# Patient Record
Sex: Male | Born: 1970 | Race: White | Hispanic: No | Marital: Married | Smoking: Former smoker
Health system: Southern US, Community
[De-identification: ages and names within clinical notes are randomized; demographics above are authoritative.]

## PROBLEM LIST (undated history)

## (undated) DIAGNOSIS — Z21 Asymptomatic human immunodeficiency virus [HIV] infection status: Secondary | ICD-10-CM

## (undated) DIAGNOSIS — B20 Human immunodeficiency virus [HIV] disease: Secondary | ICD-10-CM

## (undated) DIAGNOSIS — A0472 Enterocolitis due to Clostridium difficile, not specified as recurrent: Secondary | ICD-10-CM

## (undated) DIAGNOSIS — B1910 Unspecified viral hepatitis B without hepatic coma: Secondary | ICD-10-CM

## (undated) DIAGNOSIS — T7840XA Allergy, unspecified, initial encounter: Secondary | ICD-10-CM

## (undated) DIAGNOSIS — Z8571 Personal history of Hodgkin lymphoma: Secondary | ICD-10-CM

## (undated) DIAGNOSIS — R569 Unspecified convulsions: Secondary | ICD-10-CM

## (undated) DIAGNOSIS — G40909 Epilepsy, unspecified, not intractable, without status epilepticus: Secondary | ICD-10-CM

## (undated) HISTORY — DX: Human immunodeficiency virus (HIV) disease: B20

## (undated) HISTORY — DX: Personal history of Hodgkin lymphoma: Z85.71

## (undated) HISTORY — PX: BACK SURGERY: SHX140

## (undated) HISTORY — PX: TONSILLECTOMY: SUR1361

## (undated) HISTORY — DX: Enterocolitis due to Clostridium difficile, not specified as recurrent: A04.72

## (undated) HISTORY — DX: Asymptomatic human immunodeficiency virus (hiv) infection status: Z21

## (undated) HISTORY — DX: Epilepsy, unspecified, not intractable, without status epilepticus: G40.909

## (undated) HISTORY — DX: Allergy, unspecified, initial encounter: T78.40XA

## (undated) HISTORY — DX: Unspecified convulsions: R56.9

---

## 1999-04-03 DIAGNOSIS — Z8571 Personal history of Hodgkin lymphoma: Secondary | ICD-10-CM

## 1999-04-03 HISTORY — DX: Personal history of Hodgkin lymphoma: Z85.71

## 1999-04-03 HISTORY — PX: PORT-A-CATH REMOVAL: SHX5289

## 1999-04-03 HISTORY — PX: LYMPH NODE BIOPSY: SHX201

## 2000-01-31 ENCOUNTER — Encounter: Payer: Self-pay | Admitting: Emergency Medicine

## 2000-01-31 ENCOUNTER — Emergency Department (HOSPITAL_COMMUNITY): Admission: EM | Admit: 2000-01-31 | Discharge: 2000-01-31 | Payer: Self-pay | Admitting: Emergency Medicine

## 2001-03-22 ENCOUNTER — Inpatient Hospital Stay (HOSPITAL_COMMUNITY): Admission: AC | Admit: 2001-03-22 | Discharge: 2001-03-29 | Payer: Self-pay

## 2001-03-22 ENCOUNTER — Encounter: Payer: Self-pay | Admitting: Emergency Medicine

## 2001-03-23 ENCOUNTER — Encounter: Payer: Self-pay | Admitting: Neurosurgery

## 2001-03-25 ENCOUNTER — Encounter: Payer: Self-pay | Admitting: General Surgery

## 2001-03-29 ENCOUNTER — Encounter: Payer: Self-pay | Admitting: Neurosurgery

## 2001-04-28 ENCOUNTER — Encounter: Payer: Self-pay | Admitting: Neurosurgery

## 2001-04-28 ENCOUNTER — Ambulatory Visit (HOSPITAL_COMMUNITY): Admission: RE | Admit: 2001-04-28 | Discharge: 2001-04-28 | Payer: Self-pay | Admitting: Neurosurgery

## 2001-07-03 ENCOUNTER — Encounter: Payer: Self-pay | Admitting: Neurosurgery

## 2001-07-03 ENCOUNTER — Ambulatory Visit (HOSPITAL_COMMUNITY): Admission: RE | Admit: 2001-07-03 | Discharge: 2001-07-03 | Payer: Self-pay | Admitting: Neurosurgery

## 2002-01-02 ENCOUNTER — Encounter: Admission: RE | Admit: 2002-01-02 | Discharge: 2002-01-02 | Payer: Self-pay | Admitting: Neurosurgery

## 2002-01-02 ENCOUNTER — Encounter: Payer: Self-pay | Admitting: Neurosurgery

## 2004-02-28 ENCOUNTER — Emergency Department (HOSPITAL_COMMUNITY): Admission: EM | Admit: 2004-02-28 | Discharge: 2004-02-28 | Payer: Self-pay | Admitting: Emergency Medicine

## 2007-03-03 ENCOUNTER — Emergency Department (HOSPITAL_COMMUNITY): Admission: EM | Admit: 2007-03-03 | Discharge: 2007-03-03 | Payer: Self-pay | Admitting: *Deleted

## 2007-06-11 ENCOUNTER — Emergency Department (HOSPITAL_COMMUNITY): Admission: EM | Admit: 2007-06-11 | Discharge: 2007-06-11 | Payer: Self-pay | Admitting: Emergency Medicine

## 2007-06-21 ENCOUNTER — Emergency Department (HOSPITAL_COMMUNITY): Admission: EM | Admit: 2007-06-21 | Discharge: 2007-06-21 | Payer: Self-pay | Admitting: Emergency Medicine

## 2007-10-14 ENCOUNTER — Ambulatory Visit (HOSPITAL_COMMUNITY): Admission: RE | Admit: 2007-10-14 | Discharge: 2007-10-14 | Payer: Self-pay | Admitting: Preventative Medicine
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
## 2010-09-19 ENCOUNTER — Ambulatory Visit (INDEPENDENT_AMBULATORY_CARE_PROVIDER_SITE_OTHER): Payer: BC Managed Care – PPO

## 2010-09-19 DIAGNOSIS — Z113 Encounter for screening for infections with a predominantly sexual mode of transmission: Secondary | ICD-10-CM

## 2010-09-19 DIAGNOSIS — B20 Human immunodeficiency virus [HIV] disease: Secondary | ICD-10-CM

## 2010-09-19 DIAGNOSIS — Z111 Encounter for screening for respiratory tuberculosis: Secondary | ICD-10-CM

## 2010-09-20 LAB — COMPREHENSIVE METABOLIC PANEL
ALT: 13 U/L (ref 0–53)
Alkaline Phosphatase: 85 U/L (ref 39–117)
CO2: 24 mEq/L (ref 19–32)
Potassium: 4.6 mEq/L (ref 3.5–5.3)
Sodium: 140 mEq/L (ref 135–145)
Total Bilirubin: 0.4 mg/dL (ref 0.3–1.2)
Total Protein: 6.6 g/dL (ref 6.0–8.3)

## 2010-09-20 LAB — HIV-1 RNA QUANT-NO REFLEX-BLD: HIV 1 RNA Quant: 35 copies/mL — ABNORMAL HIGH (ref <20)

## 2010-09-20 LAB — CBC WITH DIFFERENTIAL/PLATELET
Eosinophils Absolute: 0.2 10*3/uL (ref 0.0–0.7)
Lymphocytes Relative: 40 % (ref 12–46)
Lymphs Abs: 2.2 10*3/uL (ref 0.7–4.0)
MCH: 32.7 pg (ref 26.0–34.0)
Neutro Abs: 2.7 10*3/uL (ref 1.7–7.7)
Neutrophils Relative %: 50 % (ref 43–77)
Platelets: 156 10*3/uL (ref 150–400)
RBC: 4.86 MIL/uL (ref 4.22–5.81)
WBC: 5.4 10*3/uL (ref 4.0–10.5)

## 2010-09-20 LAB — RPR

## 2010-09-20 LAB — HEPATITIS B CORE ANTIBODY, IGM: Hep B C IgM: NEGATIVE

## 2010-09-20 LAB — HEPATITIS A ANTIBODY, IGM: Hep A IgM: NEGATIVE

## 2010-09-20 LAB — LIPID PANEL
HDL: 48 mg/dL (ref >39)
LDL Cholesterol: 123 mg/dL — ABNORMAL HIGH (ref 0–99)
Total CHOL/HDL Ratio: 4.1 Ratio

## 2010-09-20 LAB — HEPATITIS B SURFACE ANTIBODY,QUALITATIVE: Hep B S Ab: POSITIVE — AB

## 2010-10-02 ENCOUNTER — Encounter: Payer: Self-pay | Admitting: Adult Health

## 2010-10-02 ENCOUNTER — Ambulatory Visit (INDEPENDENT_AMBULATORY_CARE_PROVIDER_SITE_OTHER): Payer: BC Managed Care – PPO | Admitting: Adult Health

## 2010-10-02 DIAGNOSIS — Z8571 Personal history of Hodgkin lymphoma: Secondary | ICD-10-CM

## 2010-10-02 DIAGNOSIS — G40909 Epilepsy, unspecified, not intractable, without status epilepticus: Secondary | ICD-10-CM

## 2010-10-02 DIAGNOSIS — B20 Human immunodeficiency virus [HIV] disease: Secondary | ICD-10-CM

## 2010-10-02 DIAGNOSIS — E785 Hyperlipidemia, unspecified: Secondary | ICD-10-CM | POA: Insufficient documentation

## 2010-10-02 DIAGNOSIS — Z21 Asymptomatic human immunodeficiency virus [HIV] infection status: Secondary | ICD-10-CM

## 2010-10-02 MED ORDER — EFAVIRENZ-EMTRICITAB-TENOFOVIR 600-200-300 MG PO TABS: 1.0000 | ORAL_TABLET | Freq: Every day | ORAL | Status: DC

## 2010-10-02 NOTE — Progress Notes (Signed)
Subjective:    Patient ID: Jorge Holt, male    DOB: 1970-07-16, 40 y.o.   MRN: [id number]  HPI 40-year-old, white male with a history of HIV, diagnosed in 2001 presents to clinic today for evaluation and ongoing care. Has a history of Hodgkin's disease, diagnosed 3 months after his HIV diagnosis for which he was treated at [facility] with full remission. Since then, he is been on antiretrovirals which has included Viracept, Ziagen, Viread, Retrovir, and Epivir. Up until 18 months ago. He had been on this regimen, but was switched to Atripla as a result of chronic GI intolerance. States his viral load has been "undetectable" for some period of time while on his antiretrovirals. He also has a history of seizure disorder since childhood and currently is taking Depakote therapy. States he has not had a seizure in several years. Today. He voices no physical complaints. He endorses adherence to his antiretrovirals with good tolerance and no complications.   Review of Systems  Constitutional: Negative for fever, chills, diaphoresis, activity change, appetite change, fatigue and unexpected weight change.  HENT: Negative for hearing loss, ear pain, nosebleeds, congestion, sore throat, facial swelling, rhinorrhea, sneezing, drooling, mouth sores, trouble swallowing, neck pain, neck stiffness, dental problem, voice change, postnasal drip, sinus pressure, tinnitus and ear discharge.   Eyes: Negative for photophobia, pain, discharge, redness, itching and visual disturbance.  Respiratory: Negative for apnea, cough, choking, chest tightness, shortness of breath, wheezing and stridor.   Cardiovascular: Negative for chest pain, palpitations and leg swelling.  Gastrointestinal: Negative for nausea, vomiting, abdominal pain, diarrhea, constipation, blood in stool, abdominal distention, anal bleeding and rectal pain.  Genitourinary: Negative for dysuria, urgency, frequency ( ), hematuria, flank pain,  decreased urine volume, discharge, penile swelling, scrotal swelling, enuresis, difficulty urinating, genital sores, penile pain and testicular pain.  Musculoskeletal: Positive for back pain. Negative for myalgias, joint swelling, arthralgias and gait problem.  Skin: Negative for color change, pallor and rash.  Neurological: Positive for seizures. Negative for dizziness, tremors, syncope, facial asymmetry, speech difficulty, weakness, light-headedness, numbness and headaches.  Hematological: Negative for adenopathy. Does not bruise/bleed easily.  Psychiatric/Behavioral: Negative for suicidal ideas, hallucinations, behavioral problems, confusion, sleep disturbance, self-injury, dysphoric mood, decreased concentration and agitation. The patient is not nervous/anxious and is not hyperactive.        Objective:   Physical Exam  Constitutional: He is oriented to person, place, and time. He appears well-developed and well-nourished. No distress.  HENT:  Head: Normocephalic and atraumatic.  Right Ear: External ear normal.  Left Ear: External ear normal.  Nose: Nose normal.  Mouth/Throat: Oropharynx is clear and moist.  Eyes: Conjunctivae and EOM are normal. Pupils are equal, round, and reactive to light. Right eye exhibits no discharge. Left eye exhibits no discharge.  Neck: Normal range of motion. Neck supple. No JVD present. No tracheal deviation present. No thyromegaly present.  Cardiovascular: Normal rate, regular rhythm, normal heart sounds and intact distal pulses.  Exam reveals no gallop and no friction rub.   No murmur heard. Pulmonary/Chest: Effort normal and breath sounds normal. No stridor. No respiratory distress. He has no wheezes. He has no rales. He exhibits no tenderness.  Abdominal: Soft. Bowel sounds are normal. He exhibits no distension and no mass. There is no tenderness. There is no rebound and no guarding.  Genitourinary: Rectum normal and penis normal.  Musculoskeletal: Normal  range of motion. He exhibits no edema and no tenderness.  There is a midline surgical scar in the LS-spine area. It is intact and well approximated  Lymphadenopathy:    He has no cervical adenopathy.  Neurological: He is alert and oriented to person, place, and time. He has normal reflexes. No cranial nerve deficit. He exhibits normal muscle tone. Coordination normal.  Skin: Skin is warm and dry. No rash noted. He is not diaphoretic. No erythema. No pallor.  Psychiatric: He has a normal mood and affect. His behavior is normal. Judgment and thought content normal.          Assessment & Plan:  1. HIV. Does obtain 09/19/2010 show a CD4 count 790 at 34% with a viral load of 35. Copies/mL. He remains clinically stable on his current regimen. Recommend continuing present management, repeating labs in 10 weeks with a followup in 3 months.  2. Seizure disorder. No recorded seizures of recent, but would advise valproic acid level due to the fact that he has been on Sustiva-based regimen. We will obtain this on his next blood draw.  3. Dyslipidemia. Was originally prescribed, Zocor, but he states he did not take it and instead utilized, exercise and diet modification. His cholesterol obtained on 09/19/2010 show a total cholesterol of 195 mg/dL, triglycerides of 161 mg/dL, HDL of 48 mg/dL calculated, LDL of 096 mg/dL, and his VLDL 24 mg/dL. Given that he has made. Most modifications himself, we will at this time concur to hold antilipemic therapy, and continue to monitor for now.  He verbally acknowledged all this information and agreed with plan of care. On his next blood draw, we will obtain, in addition to staging labs, lipids, valproic acid level, testosterone, free and total, and hepatitis. A total antibody.

## 2010-12-25 LAB — COMPREHENSIVE METABOLIC PANEL
Alkaline Phosphatase: 69
BUN: 12
Chloride: 108
Glucose, Bld: 91
Potassium: 4.3
Total Bilirubin: 0.6

## 2011-01-08 LAB — DIFFERENTIAL
Basophils Relative: 0
Monocytes Relative: 5
Neutro Abs: 5.8
Neutrophils Relative %: 77

## 2011-01-08 LAB — COMPREHENSIVE METABOLIC PANEL
Alkaline Phosphatase: 88
BUN: 14
Calcium: 9.3
GFR calc non Af Amer: >60
Glucose, Bld: 105 — ABNORMAL HIGH
Potassium: 4.3
Total Protein: 6

## 2011-01-08 LAB — RAPID URINE DRUG SCREEN, HOSP PERFORMED
Barbiturates: NOT DETECTED
Opiates: NOT DETECTED

## 2011-01-08 LAB — CBC
HCT: 44.5
Hemoglobin: 15.1
MCHC: 34.1
RDW: 12.1

## 2011-02-06 ENCOUNTER — Telehealth: Payer: Self-pay | Admitting: *Deleted

## 2011-02-06 NOTE — Telephone Encounter (Signed)
I left message to call & schedule appt. If he has questions about this to call & ask for me

## 2011-03-01 ENCOUNTER — Encounter: Payer: Self-pay | Admitting: *Deleted

## 2011-03-06 ENCOUNTER — Other Ambulatory Visit: Payer: Self-pay | Admitting: Adult Health

## 2011-03-06 DIAGNOSIS — B20 Human immunodeficiency virus [HIV] disease: Secondary | ICD-10-CM

## 2011-03-14 ENCOUNTER — Telehealth: Payer: Self-pay | Admitting: *Deleted

## 2011-03-14 NOTE — Telephone Encounter (Signed)
Pt's PCP is Dr. Madelin Rear @ [facility].  Would appreciate 03/21/11 lab results being faxed to Dr. Flavia Shipper office when available.  Pt verbalized understanding to come to [facility] to have labs drawn 2-4pm.

## 2011-03-21 ENCOUNTER — Other Ambulatory Visit (INDEPENDENT_AMBULATORY_CARE_PROVIDER_SITE_OTHER): Payer: BC Managed Care – PPO

## 2011-03-21 ENCOUNTER — Other Ambulatory Visit: Payer: Self-pay | Admitting: Infectious Diseases

## 2011-03-21 DIAGNOSIS — G40909 Epilepsy, unspecified, not intractable, without status epilepticus: Secondary | ICD-10-CM

## 2011-03-21 DIAGNOSIS — B20 Human immunodeficiency virus [HIV] disease: Secondary | ICD-10-CM

## 2011-03-21 DIAGNOSIS — E785 Hyperlipidemia, unspecified: Secondary | ICD-10-CM

## 2011-03-21 LAB — LIPID PANEL
HDL: 46 mg/dL (ref >39)
Total CHOL/HDL Ratio: 3.8 Ratio
VLDL: 17 mg/dL (ref 0–40)

## 2011-03-22 LAB — COMPLETE METABOLIC PANEL WITH GFR
ALT: 15 U/L (ref 0–53)
Albumin: 4.5 g/dL (ref 3.5–5.2)
CO2: 24 mEq/L (ref 19–32)
Chloride: 104 mEq/L (ref 96–112)
GFR, Est African American: >89 mL/min
GFR, Est Non African American: >89 mL/min
Glucose, Bld: 79 mg/dL (ref 70–99)
Potassium: 4.6 mEq/L (ref 3.5–5.3)
Sodium: 140 mEq/L (ref 135–145)
Total Protein: 6.3 g/dL (ref 6.0–8.3)

## 2011-03-22 LAB — CBC WITH DIFFERENTIAL/PLATELET
Hemoglobin: 15.7 g/dL (ref 13.0–17.0)
Lymphocytes Relative: 44 % (ref 12–46)
Lymphs Abs: 2.3 10*3/uL (ref 0.7–4.0)
MCH: 32.6 pg (ref 26.0–34.0)
Monocytes Relative: 7 % (ref 3–12)
Neutro Abs: 2.3 10*3/uL (ref 1.7–7.7)
Neutrophils Relative %: 45 % (ref 43–77)
RBC: 4.81 MIL/uL (ref 4.22–5.81)
WBC: 5.1 10*3/uL (ref 4.0–10.5)

## 2011-03-22 LAB — TESTOSTERONE, FREE, TOTAL, SHBG
Sex Hormone Binding: 131 nmol/L — ABNORMAL HIGH (ref 13–71)
Testosterone: 922.37 ng/dL — ABNORMAL HIGH (ref 250–890)

## 2011-03-22 LAB — T-HELPER CELL (CD4) - (RCID CLINIC ONLY)
CD4 % Helper T Cell: 33 % (ref 33–55)
CD4 T Cell Abs: 720 uL (ref 400–2700)

## 2011-04-09 ENCOUNTER — Encounter: Payer: Self-pay | Admitting: Infectious Diseases

## 2011-04-09 ENCOUNTER — Ambulatory Visit (INDEPENDENT_AMBULATORY_CARE_PROVIDER_SITE_OTHER): Payer: BC Managed Care – PPO | Admitting: Infectious Diseases

## 2011-04-09 ENCOUNTER — Other Ambulatory Visit (INDEPENDENT_AMBULATORY_CARE_PROVIDER_SITE_OTHER): Payer: BC Managed Care – PPO | Admitting: *Deleted

## 2011-04-09 VITALS — BP 126/78 | HR 74 | Temp 97.5°F | Ht 68.0 in | Wt 162.8 lb

## 2011-04-09 DIAGNOSIS — Z79899 Other long term (current) drug therapy: Secondary | ICD-10-CM

## 2011-04-09 DIAGNOSIS — Z23 Encounter for immunization: Secondary | ICD-10-CM

## 2011-04-09 DIAGNOSIS — J329 Chronic sinusitis, unspecified: Secondary | ICD-10-CM

## 2011-04-09 DIAGNOSIS — Z21 Asymptomatic human immunodeficiency virus [HIV] infection status: Secondary | ICD-10-CM

## 2011-04-09 DIAGNOSIS — Z113 Encounter for screening for infections with a predominantly sexual mode of transmission: Secondary | ICD-10-CM

## 2011-04-09 DIAGNOSIS — Z0000 Encounter for general adult medical examination without abnormal findings: Secondary | ICD-10-CM

## 2011-04-09 DIAGNOSIS — B20 Human immunodeficiency virus [HIV] disease: Secondary | ICD-10-CM

## 2011-04-09 MED ORDER — AZITHROMYCIN 500 MG PO TABS: 500.0000 mg | ORAL_TABLET | Freq: Every day | ORAL | Status: AC

## 2011-04-09 NOTE — Progress Notes (Signed)
  Subjective:    Patient ID: Jorge Holt, male    DOB: 1970/12/11, 41 y.o.   MRN: [id number]  HPI 41 yo M with hx of seizure d/o, HIV and Hodgkin's disease, diagnosed in 2001.  His initial ART included Viracept, Ziagen, Viread, Retrovir, and Epivir. until 18 months ago. He was switched to Atripla for simplification. Was prev dx as hyperlipidemia but changed to high fiber diet and Chol has dropped 30 points, lost 20#.   Has been doing well, occas sinusitis. Has fever on 04-02-11. Green d/c.  Last CD4 720, VL <20 (03-21-11).     Review of Systems  Constitutional: Negative for appetite change and unexpected weight change.  Respiratory: Negative for shortness of breath.   Gastrointestinal: Negative for diarrhea and constipation.  Genitourinary: Negative for dysuria.       Objective:   Physical Exam  Constitutional: He appears well-developed and well-nourished.  Eyes: EOM are normal. Pupils are equal, round, and reactive to light.  Neck: Neck supple.  Cardiovascular: Normal rate, regular rhythm and normal heart sounds.   Pulmonary/Chest: Effort normal and breath sounds normal.  Abdominal: Soft. Bowel sounds are normal. He exhibits no distension. There is no tenderness.  Lymphadenopathy:    He has no cervical adenopathy.          Assessment & Plan:

## 2011-04-09 NOTE — Assessment & Plan Note (Signed)
He is doing very well. Will cont his ART. Offered condoms, refuses. Refuses flu shot (made him sick), will take PNVX. Also needs to start Hep A series.  rtc 6 months.

## 2011-07-11 ENCOUNTER — Other Ambulatory Visit: Payer: Self-pay | Admitting: Infectious Diseases

## 2012-02-13 ENCOUNTER — Encounter (HOSPITAL_BASED_OUTPATIENT_CLINIC_OR_DEPARTMENT_OTHER): Payer: Self-pay | Admitting: *Deleted

## 2012-02-13 NOTE — H&P (Signed)
  [facility] [address]   [address] [place], [place] [address] [phone] A Division of [facility]  Loreta Ave, M.D.   Robert A. Thurston Hole, M.D.   Burnell Blanks, M.D.   Eulas Post, M.D.   Lunette Stands, M.D Buford Dresser, M.D.  Charlsie Quest, M.D.   Estell Harpin, M.D.   Melina Fiddler, M.D. Genene Churn. Barry Dienes, PA-C            Kirstin A. Shepperson, PA-C Josh San Marine, PA-C [place], [place]   RE: [place], [place]                                [id number]      DOB: Mar 30, 1971 PROGRESS NOTE: 02-12-12 Jorge Holt is seen for evaluation of a new injury to his right wrist.  New patient to me.  He has been seen at an outside hospital, as well as at Urgent Care by Dr. Lunette Stands.  A vertical load dorsiflexion injury, right wrist.  Displaced angulated extraarticular distal radius fracture on the right.  Closed injury.  Neurovascularly intact.  He comes in to discuss definitive treatment.  Previous x-rays reviewed.  Some comminution, not extreme.  Most of the comminution dorsally.  Reasonable bone stock.  Fracture does look extraarticular.   Remaining history is reviewed, updated and included in the chart.  He is HIV positive, under good control.  He states his viral count is less than 50.  This is under chronic treatment and under good control.    EXAMINATION: General exam is outlined and included in the chart.  Specifically, obvious deformity of his fracture.  Some distal swelling, not too extreme.  Neurovascularly intact.    DISPOSITION:  A new well padded short arm splint is applied.  Continue elevation for swelling.  We discussed definitive treatment.  Closed versus open option thoroughly discussed, including the risks, benefits and complications in detail.  He wants to proceed with open reduction internal fixation with a volar plate, which would be my recommendation.  Paperwork complete.  All questions answered.  I  will see him at the time of operative intervention.  Based on his job and the ability to work one handed he could potentially go back to work in as early as one week.    Loreta Ave, M.D.   Electronically verified by Loreta Ave, M.D. DFM:jjh D 02-12-12 T 02-13-12

## 2012-02-13 NOTE — Progress Notes (Signed)
Pt has HIV-sees dr hatcher-ID cone Had labs 1/13 Doing well Hx seizures-last 4 yr ago remission hodgkins

## 2012-02-14 ENCOUNTER — Encounter (HOSPITAL_BASED_OUTPATIENT_CLINIC_OR_DEPARTMENT_OTHER): Payer: Self-pay | Admitting: Anesthesiology

## 2012-02-14 ENCOUNTER — Encounter (HOSPITAL_BASED_OUTPATIENT_CLINIC_OR_DEPARTMENT_OTHER)
Admission: RE | Disposition: A | Discharge status: Home / Self Care | Payer: Self-pay | Source: Ambulatory Visit | Attending: Orthopedic Surgery

## 2012-02-14 ENCOUNTER — Ambulatory Visit (HOSPITAL_BASED_OUTPATIENT_CLINIC_OR_DEPARTMENT_OTHER)
Admission: RE | Admit: 2012-02-14 | Discharge: 2012-02-14 | Disposition: A | Discharge status: Home / Self Care | Payer: 59 | Source: Ambulatory Visit | Attending: Orthopedic Surgery | Admitting: Orthopedic Surgery

## 2012-02-14 ENCOUNTER — Encounter (HOSPITAL_BASED_OUTPATIENT_CLINIC_OR_DEPARTMENT_OTHER): Payer: Self-pay | Admitting: *Deleted

## 2012-02-14 ENCOUNTER — Ambulatory Visit (HOSPITAL_BASED_OUTPATIENT_CLINIC_OR_DEPARTMENT_OTHER): Payer: 59 | Admitting: Anesthesiology

## 2012-02-14 DIAGNOSIS — S52509A Unspecified fracture of the lower end of unspecified radius, initial encounter for closed fracture: Secondary | ICD-10-CM

## 2012-02-14 DIAGNOSIS — Z21 Asymptomatic human immunodeficiency virus [HIV] infection status: Secondary | ICD-10-CM | POA: Insufficient documentation

## 2012-02-14 DIAGNOSIS — X58XXXA Exposure to other specified factors, initial encounter: Secondary | ICD-10-CM | POA: Insufficient documentation

## 2012-02-14 DIAGNOSIS — S52599A Other fractures of lower end of unspecified radius, initial encounter for closed fracture: Secondary | ICD-10-CM | POA: Insufficient documentation

## 2012-02-14 HISTORY — DX: Human immunodeficiency virus (HIV) disease: B20

## 2012-02-14 HISTORY — PX: OPEN REDUCTION INTERNAL FIXATION (ORIF) DISTAL RADIAL FRACTURE: SHX5989

## 2012-02-14 HISTORY — DX: Unspecified viral hepatitis B without hepatic coma: B19.10

## 2012-02-14 HISTORY — DX: Asymptomatic human immunodeficiency virus (hiv) infection status: Z21

## 2012-02-14 LAB — POCT HEMOGLOBIN-HEMACUE: Hemoglobin: 15 g/dL (ref 13.0–17.0)

## 2012-02-14 SURGERY — OPEN REDUCTION INTERNAL FIXATION (ORIF) DISTAL RADIUS FRACTURE
Anesthesia: General | Site: Arm Lower | Laterality: Right | Wound class: Clean

## 2012-02-14 MED ORDER — FENTANYL CITRATE 0.05 MG/ML IJ SOLN
INTRAMUSCULAR | Status: DC | PRN
  Administered 2012-02-14: 25 ug via INTRAVENOUS

## 2012-02-14 MED ORDER — HYDROMORPHONE HCL PF 1 MG/ML IJ SOLN: 0.2500 mg | INTRAMUSCULAR | Status: DC | PRN

## 2012-02-14 MED ORDER — OXYCODONE HCL 5 MG PO TABS: 5.0000 mg | ORAL_TABLET | Freq: Once | ORAL | Status: DC | PRN

## 2012-02-14 MED ORDER — 0.9 % SODIUM CHLORIDE (POUR BTL) OPTIME
TOPICAL | Status: DC | PRN
  Administered 2012-02-14: 1000 mL

## 2012-02-14 MED ORDER — MIDAZOLAM HCL 2 MG/2ML IJ SOLN
1.0000 mg | INTRAMUSCULAR | Status: DC | PRN
  Administered 2012-02-14: 2 mg via INTRAVENOUS

## 2012-02-14 MED ORDER — MIDAZOLAM HCL 5 MG/5ML IJ SOLN
INTRAMUSCULAR | Status: DC | PRN
  Administered 2012-02-14: 2 mg via INTRAVENOUS

## 2012-02-14 MED ORDER — ONDANSETRON HCL 4 MG/2ML IJ SOLN
INTRAMUSCULAR | Status: DC | PRN
  Administered 2012-02-14: 4 mg via INTRAVENOUS

## 2012-02-14 MED ORDER — PROPOFOL 10 MG/ML IV BOLUS
INTRAVENOUS | Status: DC | PRN
  Administered 2012-02-14: 200 mg via INTRAVENOUS

## 2012-02-14 MED ORDER — LACTATED RINGERS IV SOLN
INTRAVENOUS | Status: DC
  Administered 2012-02-14: 12:00:00 via INTRAVENOUS

## 2012-02-14 MED ORDER — CEFAZOLIN SODIUM-DEXTROSE 2-3 GM-% IV SOLR
2.0000 g | INTRAVENOUS | Status: AC
  Administered 2012-02-14: 2 g via INTRAVENOUS

## 2012-02-14 MED ORDER — MIDAZOLAM HCL 2 MG/2ML IJ SOLN: 1.0000 mg | INTRAMUSCULAR | Status: DC | PRN

## 2012-02-14 MED ORDER — BUPIVACAINE-EPINEPHRINE PF 0.5-1:200000 % IJ SOLN
INTRAMUSCULAR | Status: DC | PRN
  Administered 2012-02-14: 25 mL

## 2012-02-14 MED ORDER — FENTANYL CITRATE 0.05 MG/ML IJ SOLN
50.0000 ug | INTRAMUSCULAR | Status: DC | PRN
  Administered 2012-02-14: 100 ug via INTRAVENOUS

## 2012-02-14 MED ORDER — OXYCODONE HCL 5 MG/5ML PO SOLN: 5.0000 mg | Freq: Once | ORAL | Status: DC | PRN

## 2012-02-14 MED ORDER — ONDANSETRON HCL 4 MG/2ML IJ SOLN: 4.0000 mg | Freq: Once | INTRAMUSCULAR | Status: DC | PRN

## 2012-02-14 MED ORDER — DEXAMETHASONE SODIUM PHOSPHATE 10 MG/ML IJ SOLN
INTRAMUSCULAR | Status: DC | PRN
  Administered 2012-02-14: 10 mg

## 2012-02-14 MED ORDER — DEXAMETHASONE SODIUM PHOSPHATE 4 MG/ML IJ SOLN
INTRAMUSCULAR | Status: DC | PRN
  Administered 2012-02-14: 10 mg via INTRAVENOUS

## 2012-02-14 MED ORDER — FENTANYL CITRATE 0.05 MG/ML IJ SOLN: 50.0000 ug | INTRAMUSCULAR | Status: DC | PRN

## 2012-02-14 SURGICAL SUPPLY — 71 items
BANDAGE ELASTIC 3 VELCRO ST LF (GAUZE/BANDAGES/DRESSINGS) ×1 IMPLANT
BANDAGE ELASTIC 4 VELCRO ST LF (GAUZE/BANDAGES/DRESSINGS) ×1 IMPLANT
BIT DRILL SOLID 2.5X110 (BIT) ×1 IMPLANT
BLADE SURG 15 STRL LF DISP TIS (BLADE) ×1 IMPLANT
BLADE SURG 15 STRL SS (BLADE) ×4
BNDG CMPR 9X4 STRL LF SNTH (GAUZE/BANDAGES/DRESSINGS) ×1
BNDG COHESIVE 3X5 TAN STRL LF (GAUZE/BANDAGES/DRESSINGS) ×2 IMPLANT
BNDG ESMARK 4X9 LF (GAUZE/BANDAGES/DRESSINGS) ×1 IMPLANT
CANISTER SUCTION 1200CC (MISCELLANEOUS) ×1 IMPLANT
CLOTH BEACON ORANGE TIMEOUT ST (SAFETY) ×2 IMPLANT
COVER TABLE BACK 60X90 (DRAPES) ×2 IMPLANT
DECANTER SPIKE VIAL GLASS SM (MISCELLANEOUS) IMPLANT
DRAPE EXTREMITY T 121X128X90 (DRAPE) ×2 IMPLANT
DRAPE OEC MINIVIEW 54X84 (DRAPES) ×1 IMPLANT
DRAPE U-SHAPE 47X51 STRL (DRAPES) ×1 IMPLANT
DURAPREP 26ML APPLICATOR (WOUND CARE) ×2 IMPLANT
ELECT NDL TIP 2.8 STRL (NEEDLE) ×1 IMPLANT
ELECT NEEDLE TIP 2.8 STRL (NEEDLE) IMPLANT
ELECT REM PT RETURN 9FT ADLT (ELECTROSURGICAL) ×2
ELECTRODE REM PT RTRN 9FT ADLT (ELECTROSURGICAL) ×1 IMPLANT
GAUZE XEROFORM 1X8 LF (GAUZE/BANDAGES/DRESSINGS) ×1 IMPLANT
GLOVE BIO SURGEON STRL SZ 6.5 (GLOVE) ×1 IMPLANT
GLOVE BIOGEL PI IND STRL 7.0 (GLOVE) IMPLANT
GLOVE BIOGEL PI IND STRL 8 (GLOVE) ×1 IMPLANT
GLOVE BIOGEL PI INDICATOR 7.0 (GLOVE) ×1
GLOVE BIOGEL PI INDICATOR 8 (GLOVE) ×1
GLOVE ORTHO TXT STRL SZ7.5 (GLOVE) ×4 IMPLANT
GOWN BRE IMP PREV XXLGXLNG (GOWN DISPOSABLE) ×2 IMPLANT
GOWN PREVENTION PLUS XLARGE (GOWN DISPOSABLE) ×2 IMPLANT
NDL HYPO 25X1 1.5 SAFETY (NEEDLE) IMPLANT
NEEDLE HYPO 25X1 1.5 SAFETY (NEEDLE) IMPLANT
NS IRRIG 1000ML POUR BTL (IV SOLUTION) ×2 IMPLANT
PACK BASIN DAY SURGERY FS (CUSTOM PROCEDURE TRAY) ×2 IMPLANT
PAD CAST 3X4 CTTN HI CHSV (CAST SUPPLIES) ×2 IMPLANT
PAD CAST 4YDX4 CTTN HI CHSV (CAST SUPPLIES) IMPLANT
PADDING CAST ABS 4INX4YD NS (CAST SUPPLIES)
PADDING CAST ABS COTTON 4X4 ST (CAST SUPPLIES) ×1 IMPLANT
PADDING CAST COTTON 3X4 STRL (CAST SUPPLIES) ×4
PADDING CAST COTTON 4X4 STRL (CAST SUPPLIES) ×4
PENCIL BUTTON HOLSTER BLD 10FT (ELECTRODE) ×2 IMPLANT
PROS SM T PL OB 3X3X53M (Orthopedic Implant) ×2 IMPLANT
PROSTHESIS SM T PL OB 3X3X53M (Orthopedic Implant) IMPLANT
SCREW CORTEX 3.5 12MM (Screw) ×1 IMPLANT
SCREW CORTEX 3.5 14MM (Screw) ×2 IMPLANT
SCREW CORTEX 3.5 20MM (Screw) ×1 IMPLANT
SCREW CORTEX 3.5 22MM (Screw) ×2 IMPLANT
SCREW LOCK CORT ST 3.5X12 (Screw) IMPLANT
SCREW LOCK CORT ST 3.5X14 (Screw) IMPLANT
SCREW LOCK CORT ST 3.5X20 (Screw) IMPLANT
SCREW LOCK CORT ST 3.5X22 (Screw) IMPLANT
SHEET MEDIUM DRAPE 40X70 STRL (DRAPES) ×2 IMPLANT
SLEEVE SCD COMPRESS KNEE MED (MISCELLANEOUS) ×1 IMPLANT
SPLINT PLASTER CAST XFAST 3X15 (CAST SUPPLIES) IMPLANT
SPLINT PLASTER XTRA FASTSET 3X (CAST SUPPLIES)
SPONGE GAUZE 4X4 12PLY (GAUZE/BANDAGES/DRESSINGS) ×2 IMPLANT
SPONGE LAP 4X18 X RAY DECT (DISPOSABLE) ×1 IMPLANT
STAPLER VISISTAT 35W (STAPLE) IMPLANT
STOCKINETTE 4X48 STRL (DRAPES) ×2 IMPLANT
SUCTION FRAZIER TIP 10 FR DISP (SUCTIONS) ×2 IMPLANT
SUT ETHILON 3 0 PS 1 (SUTURE) IMPLANT
SUT VIC AB 2-0 SH 27 (SUTURE) ×2
SUT VIC AB 2-0 SH 27XBRD (SUTURE) ×1 IMPLANT
SUT VIC AB 3-0 SH 27 (SUTURE)
SUT VIC AB 3-0 SH 27X BRD (SUTURE) IMPLANT
SYR BULB 3OZ (MISCELLANEOUS) ×2 IMPLANT
SYR CONTROL 10ML LL (SYRINGE) IMPLANT
TOWEL OR 17X24 6PK STRL BLUE (TOWEL DISPOSABLE) ×2 IMPLANT
TUBE CONNECTING 20X1/4 (TUBING) ×2 IMPLANT
UNDERPAD 30X30 INCONTINENT (UNDERPADS AND DIAPERS) ×2 IMPLANT
VICRYL 2.0 SUTURE BLUNT NEEDLE ×1 IMPLANT
WATER STERILE IRR 1000ML POUR (IV SOLUTION) ×1 IMPLANT

## 2012-02-14 NOTE — Transfer of Care (Signed)
Immediate Anesthesia Transfer of Care Note  Patient: Jorge Holt  Procedure(s) Performed: Procedure(s) (LRB) with comments: OPEN REDUCTION INTERNAL FIXATION (ORIF) DISTAL RADIAL FRACTURE (Right)  Patient Location: PACU  Anesthesia Type:General  Level of Consciousness: awake  Airway & Oxygen Therapy: Patient Spontanous Breathing and Patient connected to face mask oxygen  Post-op Assessment: Report given to PACU RN and Post -op Vital signs reviewed and stable  Post vital signs: Reviewed and stable  Complications: No apparent anesthesia complications

## 2012-02-14 NOTE — Progress Notes (Signed)
Assisted Dr. Crews with right, ultrasound guided, supraclavicular block. Side rails up, monitors on throughout procedure. See vital signs in flow sheet. Tolerated Procedure well. 

## 2012-02-14 NOTE — Brief Op Note (Signed)
02/14/2012  1:56 PM  PATIENT:  Jorge Holt  41 y.o. male  PRE-OPERATIVE DIAGNOSIS:  Right distal radius fracture   POST-OPERATIVE DIAGNOSIS:  Right distal radius fracture  PROCEDURE:  Procedure(s) (LRB) with comments: OPEN REDUCTION INTERNAL FIXATION (ORIF) DISTAL RADIAL FRACTURE (Right)  SURGEON:  Surgeon(s) and Role:    * Loreta Ave, MD - Primary  PHYSICIAN ASSISTANT: Zonia Kief M   ANESTHESIA:   general  EBL:  Total I/O In: 1700 [I.V.:1700] Out: -   SPECIMEN:  No Specimen  DISPOSITION OF SPECIMEN:  N/A  COUNTS:  YES  TOURNIQUET:   Total Tourniquet Time Documented: Upper Arm (Right) - 57 minutes   PATIENT DISPOSITION:  PACU - hemodynamically stable.

## 2012-02-14 NOTE — Anesthesia Procedure Notes (Addendum)
Anesthesia Regional Block:  Supraclavicular block  Pre-Anesthetic Checklist: ,, timeout performed, Correct Patient, Correct Site, Correct Laterality, Correct Procedure, Correct Position, site marked, Risks and benefits discussed,  Surgical consent,  Pre-op evaluation,  At surgeon's request and post-op pain management  Laterality: Right and Upper  Prep: chloraprep       Needles:  Injection technique: Single-shot  Needle Type: Echogenic Needle     Needle Length: 5cm 5 cm Needle Gauge: 21    Additional Needles:  Procedures: ultrasound guided (picture in chart) Supraclavicular block Narrative:  Start time: 02/14/2012 11:45 AM End time: 02/14/2012 11:53 AM Injection made incrementally with aspirations every 5 mL.  Performed by: Personally  Anesthesiologist: Sheldon Silvan  Supraclavicular block Procedure Name: LMA Insertion Performed by: York Grice Pre-anesthesia Checklist: Patient identified, Timeout performed, Emergency Drugs available, Suction available and Patient being monitored Patient Re-evaluated:Patient Re-evaluated prior to inductionOxygen Delivery Method: Circle system utilized Preoxygenation: Pre-oxygenation with 100% oxygen Intubation Type: IV induction Ventilation: Mask ventilation without difficulty LMA: LMA inserted LMA Size: 4.0 Number of attempts: 1 Placement Confirmation: breath sounds checked- equal and bilateral and positive ETCO2 Tube secured with: Tape Dental Injury: Teeth and Oropharynx as per pre-operative assessment

## 2012-02-14 NOTE — Anesthesia Preprocedure Evaluation (Signed)
Anesthesia Evaluation  Patient identified by MRN, date of birth, ID band Patient awake    Reviewed: Allergy & Precautions, H&P , NPO status , Patient's Chart, lab work & pertinent test results  Airway Mallampati: I TM Distance: >3 FB Neck ROM: Full    Dental  (+) Teeth Intact and Dental Advisory Given   Pulmonary  breath sounds clear to auscultation        Cardiovascular Rhythm:Regular Rate:Normal     Neuro/Psych    GI/Hepatic (+) Hepatitis -, B  Endo/Other    Renal/GU      Musculoskeletal   Abdominal   Peds  Hematology   Anesthesia Other Findings HIV +  Reproductive/Obstetrics                           Anesthesia Physical Anesthesia Plan  ASA: II  Anesthesia Plan: General   Post-op Pain Management:    Induction: Intravenous  Airway Management Planned:   Additional Equipment:   Intra-op Plan:   Post-operative Plan: Extubation in OR  Informed Consent: I have reviewed the patients History and Physical, chart, labs and discussed the procedure including the risks, benefits and alternatives for the proposed anesthesia with the patient or authorized representative who has indicated his/her understanding and acceptance.   Dental advisory given  Plan Discussed with: CRNA, Anesthesiologist and Surgeon  Anesthesia Plan Comments:         Anesthesia Quick Evaluation

## 2012-02-14 NOTE — Anesthesia Postprocedure Evaluation (Signed)
  Anesthesia Post-op Note  Patient: Jorge Holt  Procedure(s) Performed: Procedure(s) (LRB) with comments: OPEN REDUCTION INTERNAL FIXATION (ORIF) DISTAL RADIAL FRACTURE (Right)  Patient Location: PACU  Anesthesia Type:General and MAC combined with regional for post-op pain  Level of Consciousness: awake, alert  and oriented  Airway and Oxygen Therapy: Patient Spontanous Breathing and Patient connected to face mask oxygen  Post-op Pain: none  Post-op Assessment: Post-op Vital signs reviewed  Post-op Vital Signs: Reviewed  Complications: No apparent anesthesia complications

## 2012-02-14 NOTE — Interval H&P Note (Signed)
History and Physical Interval Note:  02/14/2012 7:24 AM  Jorge Holt  has presented today for surgery, with the diagnosis of right distal radius fx   The various methods of treatment have been discussed with the patient and family. After consideration of risks, benefits and other options for treatment, the patient has consented to  Procedure(s) (LRB) with comments: OPEN REDUCTION INTERNAL FIXATION (ORIF) DISTAL RADIAL FRACTURE (Right) as a surgical intervention .  The patient's history has been reviewed, patient examined, no change in status, stable for surgery.  I have reviewed the patient's chart and labs.  Questions were answered to the patient's satisfaction.     Dynasia Kercheval F

## 2012-02-15 ENCOUNTER — Encounter (HOSPITAL_BASED_OUTPATIENT_CLINIC_OR_DEPARTMENT_OTHER): Payer: Self-pay | Admitting: Orthopedic Surgery

## 2012-02-15 NOTE — Op Note (Signed)
Jorge Holt, Jorge Holt              ACCOUNT NO.:  [id number]  MEDICAL RECORD NO.:  [id number]  LOCATION:                                 FACILITY:  PHYSICIAN:  Loreta Ave, M.D. DATE OF BIRTH:  1970/06/30  DATE OF PROCEDURE:  02/14/2012 DATE OF DISCHARGE:                              OPERATIVE REPORT   PREOPERATIVE DIAGNOSIS:  Closed displaced comminuted distal radius fracture, right.  Extension into the distal radioulnar joint.  POSTOPERATIVE DIAGNOSIS:  Closed displaced comminuted distal radius fracture, right.  Extension into the distal radioulnar joint.  PROCEDURE:  Open reduction and internal fixation right distal radius fracture with a volar placed Synthes nonlocking plate and screws.  SURGEON:  Loreta Ave, M.D.  ASSISTANT:  Genene Churn. Barry Dienes, [place]  ANESTHESIA:  General.  BLOOD LOSS:  Minimal.  SPECIMENS:  None.  CULTURES:  None.  COMPLICATION:  None.  DRESSINGS:  Soft compressive short-arm splint.  TOURNIQUET TIME:  45 minutes.  PROCEDURE:  The patient was brought to the operating room, placed on operating table in supine position.  After adequate anesthesia had been obtained, tourniquet applied.  Prepped and draped in the usual sterile fashion.  Exsanguinated with elevation, Esmarch.  Tourniquet inflated to 250 mmHg.  A silver-fork deformity of his wrist.  Closed reduction to align this.  Volar incision along the flexor carpi radialis.  Skin and subcutaneous tissue divided, protecting neurovascular structures. Subperiosteal exposure of the fracture.  This was cleared out.  Able to be reduced anatomically.  Comminution especially on the styloid side. Reasonable bone stock.  I reduced anatomically, confirmed fluoroscopically.  Fixed with a volar pre-bent T-plate Synthes type. Three distal 3 proximal screws.  At completion, nice solid stable fixation.  Anatomic alignment of the fracture as well as the alignment at the distal RU joint.  Full passive  motion.  Wound irrigated.  Skin closed with Vicryl and nylon.  Sterile compressive dressing applied.  Tourniquet deflated removed.  Short-arm splint.  Well-padded applied.  Anesthesia reversed. Brought to the recovery room.  Tolerated surgery well.  No complications.     Loreta Ave, M.D.     DFM/MEDQ  D:  02/14/2012  T:  02/15/2012  Job:  [id number]

## 2012-04-07 ENCOUNTER — Other Ambulatory Visit: Payer: Self-pay | Admitting: *Deleted

## 2012-04-07 DIAGNOSIS — B20 Human immunodeficiency virus [HIV] disease: Secondary | ICD-10-CM

## 2012-04-07 MED ORDER — EFAVIRENZ-EMTRICITAB-TENOFOVIR 600-200-300 MG PO TABS: 1.0000 | ORAL_TABLET | Freq: Every day | ORAL | Status: AC

## 2012-07-03 ENCOUNTER — Encounter: Payer: Self-pay | Admitting: *Deleted

## 2013-04-27 ENCOUNTER — Encounter: Payer: Self-pay | Admitting: *Deleted

## 2013-04-27 ENCOUNTER — Other Ambulatory Visit: Payer: Self-pay | Admitting: *Deleted

## 2013-04-27 NOTE — Telephone Encounter (Signed)
Faxed refill request back with message for pt to call [facility] for appointment and no refills.  Sent letter to pt with the same information.   Last OV 04/2011.

## 2018-09-30 ENCOUNTER — Observation Stay
Admission: EM | Admit: 2018-09-30 | Discharge: 2018-10-02 | Disposition: A | Discharge status: Home / Self Care | Payer: BC Managed Care – PPO | Attending: Internal Medicine | Admitting: Internal Medicine

## 2018-09-30 ENCOUNTER — Emergency Department: Payer: BC Managed Care – PPO

## 2018-09-30 ENCOUNTER — Other Ambulatory Visit: Payer: Self-pay

## 2018-09-30 ENCOUNTER — Encounter: Payer: Self-pay | Admitting: Internal Medicine

## 2018-09-30 DIAGNOSIS — Z8571 Personal history of Hodgkin lymphoma: Secondary | ICD-10-CM | POA: Diagnosis not present

## 2018-09-30 DIAGNOSIS — Z87891 Personal history of nicotine dependence: Secondary | ICD-10-CM | POA: Insufficient documentation

## 2018-09-30 DIAGNOSIS — Z1159 Encounter for screening for other viral diseases: Secondary | ICD-10-CM | POA: Diagnosis not present

## 2018-09-30 DIAGNOSIS — G40909 Epilepsy, unspecified, not intractable, without status epilepticus: Principal | ICD-10-CM | POA: Insufficient documentation

## 2018-09-30 DIAGNOSIS — R05 Cough: Secondary | ICD-10-CM | POA: Diagnosis present

## 2018-09-30 DIAGNOSIS — Z79899 Other long term (current) drug therapy: Secondary | ICD-10-CM | POA: Diagnosis not present

## 2018-09-30 DIAGNOSIS — J302 Other seasonal allergic rhinitis: Secondary | ICD-10-CM | POA: Insufficient documentation

## 2018-09-30 DIAGNOSIS — N179 Acute kidney failure, unspecified: Secondary | ICD-10-CM | POA: Diagnosis not present

## 2018-09-30 DIAGNOSIS — D72829 Elevated white blood cell count, unspecified: Secondary | ICD-10-CM | POA: Insufficient documentation

## 2018-09-30 DIAGNOSIS — B20 Human immunodeficiency virus [HIV] disease: Secondary | ICD-10-CM | POA: Diagnosis not present

## 2018-09-30 DIAGNOSIS — R569 Unspecified convulsions: Secondary | ICD-10-CM | POA: Diagnosis present

## 2018-09-30 DIAGNOSIS — E785 Hyperlipidemia, unspecified: Secondary | ICD-10-CM | POA: Insufficient documentation

## 2018-09-30 DIAGNOSIS — J690 Pneumonitis due to inhalation of food and vomit: Secondary | ICD-10-CM | POA: Insufficient documentation

## 2018-09-30 DIAGNOSIS — E860 Dehydration: Secondary | ICD-10-CM | POA: Insufficient documentation

## 2018-09-30 DIAGNOSIS — R413 Other amnesia: Secondary | ICD-10-CM | POA: Insufficient documentation

## 2018-09-30 DIAGNOSIS — R059 Cough, unspecified: Secondary | ICD-10-CM

## 2018-09-30 LAB — CBC WITH DIFFERENTIAL/PLATELET
Abs Immature Granulocytes: 0.08 10*3/uL — ABNORMAL HIGH (ref 0.00–0.07)
Basophils Absolute: 0 10*3/uL (ref 0.0–0.1)
Basophils Relative: 0 %
Eosinophils Absolute: 0 10*3/uL (ref 0.0–0.5)
Eosinophils Relative: 0 %
HCT: 46.2 % (ref 39.0–52.0)
Hemoglobin: 15.9 g/dL (ref 13.0–17.0)
Immature Granulocytes: 1 %
Lymphocytes Relative: 7 %
Lymphs Abs: 1 10*3/uL (ref 0.7–4.0)
MCH: 31.5 pg (ref 26.0–34.0)
MCHC: 34.4 g/dL (ref 30.0–36.0)
MCV: 91.7 fL (ref 80.0–100.0)
Monocytes Absolute: 1.1 10*3/uL — ABNORMAL HIGH (ref 0.1–1.0)
Monocytes Relative: 7 %
Neutro Abs: 12.8 10*3/uL — ABNORMAL HIGH (ref 1.7–7.7)
Neutrophils Relative %: 85 %
Platelets: 176 10*3/uL (ref 150–400)
RBC: 5.04 MIL/uL (ref 4.22–5.81)
RDW: 11.9 % (ref 11.5–15.5)
WBC: 15 10*3/uL — ABNORMAL HIGH (ref 4.0–10.5)
nRBC: 0 % (ref 0.0–0.2)

## 2018-09-30 LAB — COMPREHENSIVE METABOLIC PANEL
ALT: 57 U/L — ABNORMAL HIGH (ref 0–44)
AST: 50 U/L — ABNORMAL HIGH (ref 15–41)
Albumin: 4.2 g/dL (ref 3.5–5.0)
Alkaline Phosphatase: 114 U/L (ref 38–126)
Anion gap: 11 (ref 5–15)
BUN: 18 mg/dL (ref 6–20)
CO2: 22 mmol/L (ref 22–32)
Calcium: 9.3 mg/dL (ref 8.9–10.3)
Chloride: 107 mmol/L (ref 98–111)
Creatinine, Ser: 1.36 mg/dL — ABNORMAL HIGH (ref 0.61–1.24)
GFR calc Af Amer: >60 mL/min (ref >60)
GFR calc non Af Amer: >60 mL/min (ref >60)
Glucose, Bld: 137 mg/dL — ABNORMAL HIGH (ref 70–99)
Potassium: 3.9 mmol/L (ref 3.5–5.1)
Sodium: 140 mmol/L (ref 135–145)
Total Bilirubin: 0.5 mg/dL (ref 0.3–1.2)
Total Protein: 7.1 g/dL (ref 6.5–8.1)

## 2018-09-30 LAB — VALPROIC ACID LEVEL: Valproic Acid Lvl: <10 ug/mL — ABNORMAL LOW (ref 50.0–100.0)

## 2018-09-30 MED ORDER — LORAZEPAM 2 MG/ML IJ SOLN
2.0000 mg | Freq: Once | INTRAMUSCULAR | Status: AC
  Administered 2018-09-30: 2 mg via INTRAVENOUS

## 2018-09-30 MED ORDER — LORAZEPAM 2 MG/ML IJ SOLN
INTRAMUSCULAR | Status: AC
  Administered 2018-09-30: 2 mg via INTRAVENOUS
  Filled 2018-09-30: qty 1

## 2018-09-30 MED ORDER — SODIUM CHLORIDE 0.9 % IV BOLUS
1000.0000 mL | Freq: Once | INTRAVENOUS | Status: AC
  Administered 2018-09-30: 1000 mL via INTRAVENOUS

## 2018-09-30 MED ORDER — ACETAMINOPHEN 500 MG PO TABS
1000.0000 mg | ORAL_TABLET | Freq: Once | ORAL | Status: AC
  Administered 2018-09-30: 1000 mg via ORAL
  Filled 2018-09-30: qty 2

## 2018-09-30 MED ORDER — SENNOSIDES-DOCUSATE SODIUM 8.6-50 MG PO TABS: 1.0000 | ORAL_TABLET | Freq: Two times a day (BID) | ORAL | Status: DC | PRN

## 2018-09-30 NOTE — ED Triage Notes (Signed)
Patient had his first seizure in 2 years tonight. He believes he has sinus infection. Patient's seizure occurred approximately 2 hours ago. Patient fell forward at about 1615hrs. Stroke screen with EMS was negative.

## 2018-09-30 NOTE — ED Provider Notes (Signed)
[facility] Emergency Department Provider Note   ____________________________________________   I have reviewed the triage vital signs and the nursing notes.   HISTORY  Chief Complaint Seizures   History limited by: Amnesia   HPI Jorge Holt is a 48 y.o. male who presents to the emergency department today after apparent seizures.  Patient cannot remember what happened today.  He states that he was told he had multiple seizures.  He does have a history of seizure disorder.  States last seizures were over 6 months ago.  He denies missing any doses of his seizure medications.  Denies any alcohol or drug use.  States he has been dealing with sinus issues for the past week.  States he has had congestion, headaches, nasal drainage.    Records reviewed. Per medical record review patient has a history of epilepsy  Past Medical History:  Diagnosis Date  . Allergy    Seasonal   . Epilepsy   . Hepatitis B    history  . HIV (human immunodeficiency virus infection)   . HIV infection   . Personal history of Hodgkin's disease 04-03-1999  . Pseudomembranous enterocolitis    10-2007  . Seizures     Patient Active Problem List   Diagnosis Date Noted  . Sinusitis 04/09/2011  . HIV (human immunodeficiency virus infection) (HCC) 10/02/2010  . Dyslipidemia 10/02/2010  . History of Hodgkin's disease 10/02/2010  . Seizure disorder (HCC) 10/02/2010    Past Surgical History:  Procedure Laterality Date  . BACK SURGERY  [id number]   lumbar surgery  2002  . LYMPH NODE BIOPSY  2001   axillary-dx hodgkins  . OPEN REDUCTION INTERNAL FIXATION (ORIF) DISTAL RADIAL FRACTURE  02/14/2012   Procedure: OPEN REDUCTION INTERNAL FIXATION (ORIF) DISTAL RADIAL FRACTURE;  Surgeon: Loreta Aveaniel F Murphy, MD;  Location: [facility];  Service: Orthopedics;  Laterality: Right;  . PORT-A-CATH REMOVAL  2001   put in and had out same yr for hodgkins  . TONSILLECTOMY      Prior  to Admission medications   Medication Sig Start Date End Date Taking? Authorizing Provider  divalproex (DEPAKOTE) 250 MG EC tablet Take 500 mg by mouth at bedtime.     [provider]  divalproex (DEPAKOTE) 250 MG EC tablet Take 250 mg by mouth every morning.     [provider]  efavirenz-emtricitabine-tenofovir (ATRIPLA) 600-200-300 MG per tablet Take 1 tablet by mouth daily. 04/07/12   Ginnie SmartHatcher, Jeffrey C, MD  fexofenadine (ALLEGRA) 180 MG tablet Take 180 mg by mouth daily.      [provider]    Allergies Patient has no known allergies.  Family History  Problem Relation Age of Onset  . COPD Father     Social History Social History   Tobacco Use  . Smoking status: Former Games developermoker  . Smokeless tobacco: Former NeurosurgeonUser    Quit date: 04/02/2008  Substance Use Topics  . Alcohol use: Yes    Alcohol/week: 1.0 standard drinks    Types: 1 drink(s) per week    Comment: social  . Drug use: No    Review of Systems Constitutional: No fever/chills Eyes: No visual changes. ENT: Positive for nasal congestion Cardiovascular: Denies chest pain. Respiratory: Denies shortness of breath. Gastrointestinal: No abdominal pain.  No nausea, no vomiting.  No diarrhea.   Genitourinary: Negative for dysuria. Musculoskeletal: Negative for back pain. Skin: Negative for rash. Neurological: Positive for seizure ____________________________________________   PHYSICAL EXAM:  VITAL SIGNS: ED  Triage Vitals [09/30/18 1933]  Enc Vitals Group     BP 122/86     Pulse Rate (!) 110     Resp 13     Temp 98.8 F (37.1 C)     Temp src      SpO2 93 %     Weight 165 lb (74.8 kg)     Height 5\' 8"  (1.727 m)     Head Circumference      Peak Flow      Pain Score 0    Constitutional: Alert and oriented.  Eyes: Conjunctivae are normal.  ENT      Head: Normocephalic and atraumatic.      Nose: No congestion/rhinnorhea.      Mouth/Throat: Mucous membranes are moist.      Neck: No  stridor. Hematological/Lymphatic/Immunilogical: No cervical lymphadenopathy. Cardiovascular: Tachycardic, regular rhythm.  No murmurs, rubs, or gallops.  Respiratory: Normal respiratory effort without tachypnea nor retractions. Breath sounds are clear and equal bilaterally. No wheezes/rales/rhonchi. Gastrointestinal: Soft and non tender. No rebound. No guarding.  Genitourinary: Deferred Musculoskeletal: Normal range of motion in all extremities. No lower extremity edema. Neurologic:  Normal speech and language. No gross focal neurologic deficits are appreciated.  Skin:  Skin is warm, dry and intact. No rash noted. Psychiatric: Mood and affect are normal. Speech and behavior are normal. Patient exhibits appropriate insight and judgment.  ____________________________________________    LABS (pertinent positives/negatives)  CMP na 140, k 3.9, glu 137, cr 1.36 CBC wbc 15.0, hgb 15.9, plt 176  ____________________________________________   EKG  I, Nance Pear, attending physician, personally viewed and interpreted this EKG  EKG Time: 1933 Rate: 109 Rhythm: sinus tachycardia Axis: normal Intervals: qtc 441 QRS: narrow ST changes: no st elevation Impression: abnormal ekg   ____________________________________________    RADIOLOGY  CT head Stable no acute findings  ____________________________________________   PROCEDURES  Procedures  ____________________________________________   INITIAL IMPRESSION / ASSESSMENT AND PLAN / ED COURSE  Pertinent labs & imaging results that were available during my care of the patient were reviewed by me and considered in my medical decision making (see chart for details).  Patient presented to the emergency department today after reported seizures.  Patient is amnesic to the day.  Does have history of seizures.  Denies missing doses.  While here in the emergency department patient had another seizure.  Given multiple seizures will  plan on admission.  ____________________________________________   FINAL CLINICAL IMPRESSION(S) / ED DIAGNOSES  Final diagnoses:  Seizure [facility])     Note: This dictation was prepared with Dragon dictation. Any transcriptional errors that result from this process are unintentional     Nance Pear, MD 09/30/18 2239

## 2018-09-30 NOTE — H&P (Signed)
[facility] [place] [facility]   PATIENT NAME: Jorge HazardMichael Holt    MR#:  [id number]  DATE OF BIRTH:  [birth year]  DATE OF ADMISSION:  09/30/2018  PRIMARY CARE PHYSICIAN: Patient, No Pcp Per   REQUESTING/REFERRING PHYSICIAN: Derrill KayGoodman, MD  CHIEF COMPLAINT:   Chief Complaint  Patient presents with  . Seizures    HISTORY OF PRESENT ILLNESS:  Jorge Holt  is a 48 y.o. male who presents with chief complaint as above.  Patient presents to the ED after seizure.  He has a history of seizure disorder and is on antiepileptics.  He states that recently he was taking some over-the-counter cold medications, and wonders if that might have interfered with his medication.  He had a recurrent seizure in the ED requiring Ativan.  Hospitalist called for admission  PAST MEDICAL HISTORY:   Past Medical History:  Diagnosis Date  . Allergy    Seasonal   . Epilepsy (HCC)   . Hepatitis B    history  . HIV (human immunodeficiency virus infection) (HCC)   . HIV infection (HCC)   . Personal history of Hodgkin's disease 04-03-1999  . Pseudomembranous enterocolitis    10-2007  . Seizures (HCC)      PAST SURGICAL HISTORY:   Past Surgical History:  Procedure Laterality Date  . BACK SURGERY  [id number]   lumbar surgery  2002  . LYMPH NODE BIOPSY  2001   axillary-dx hodgkins  . OPEN REDUCTION INTERNAL FIXATION (ORIF) DISTAL RADIAL FRACTURE  02/14/2012   Procedure: OPEN REDUCTION INTERNAL FIXATION (ORIF) DISTAL RADIAL FRACTURE;  Surgeon: Loreta Aveaniel F Murphy, MD;  Location: [facility];  Service: Orthopedics;  Laterality: Right;  . PORT-A-CATH REMOVAL  2001   put in and had out same yr for hodgkins  . TONSILLECTOMY       SOCIAL HISTORY:   Social History   Tobacco Use  . Smoking status: Former Games developermoker  . Smokeless tobacco: Former NeurosurgeonUser    Quit date: 04/02/2008  Substance Use Topics  . Alcohol use: Yes    Alcohol/week: 1.0 standard drinks    Types: 1 drink(s)  per week    Comment: social     FAMILY HISTORY:   Family History  Problem Relation Age of Onset  . COPD Father      DRUG ALLERGIES:  No Known Allergies  MEDICATIONS AT HOME:   Prior to Admission medications   Medication Sig Start Date End Date Taking? Authorizing Provider  albuterol (VENTOLIN HFA) 108 (90 Base) MCG/ACT inhaler Inhale 2 puffs into the lungs every 6 (six) hours as needed.   Yes [provider]  efavirenz-emtricitabine-tenofovir (ATRIPLA) 600-200-300 MG per tablet Take 1 tablet by mouth daily. 04/07/12  Yes Ginnie SmartHatcher, Jeffrey C, MD  levETIRAcetam (KEPPRA) 1000 MG tablet Take 1,000 mg by mouth every 12 (twelve) hours.   Yes [provider]  VYVANSE 20 MG capsule Take 20 mg by mouth 2 (two) times daily. 09/19/18  Yes [provider]  divalproex (DEPAKOTE) 250 MG EC tablet Take 500 mg by mouth at bedtime.     [provider]  divalproex (DEPAKOTE) 250 MG EC tablet Take 250 mg by mouth every morning.     [provider]  fexofenadine (ALLEGRA) 180 MG tablet Take 180 mg by mouth daily.      [provider]    REVIEW OF SYSTEMS:  Review of Systems  Unable to perform ROS: Acuity of condition     VITAL  SIGNS:   Vitals:   09/30/18 2150 09/30/18 2200 09/30/18 2215 09/30/18 2230  BP: 110/82 124/88  129/90  Pulse: (!) 114 (!) 116 (!) 115 (!) 113  Resp: (!) 24 (!) 23 19 (!) 21  Temp:      SpO2: 92% 91% 92% 93%  Weight:      Height:       Wt Readings from Last 3 Encounters:  09/30/18 74.8 kg  02/14/12 74.8 kg  04/09/11 73.8 kg    PHYSICAL EXAMINATION:  Physical Exam  Vitals reviewed. Constitutional: He appears well-developed and well-nourished. No distress.  HENT:  Head: Normocephalic and atraumatic.  Mouth/Throat: Oropharynx is clear and moist.  Eyes: Pupils are equal, round, and reactive to light. Conjunctivae and EOM are normal. No scleral icterus.  Neck: Normal range of motion. Neck supple. No JVD  present. No thyromegaly present.  Cardiovascular: Normal rate, regular rhythm and intact distal pulses. Exam reveals no gallop and no friction rub.  No murmur heard. Respiratory: Effort normal and breath sounds normal. No respiratory distress. He has no wheezes. He has no rales.  GI: Soft. Bowel sounds are normal. He exhibits no distension. There is no abdominal tenderness.  Musculoskeletal: Normal range of motion.        General: No edema.     Comments: No arthritis, no gout  Lymphadenopathy:    He has no cervical adenopathy.  Neurological: No cranial nerve deficit.  Unable to fully assess due to patient condition  Skin: Skin is warm and dry. No rash noted. No erythema.  Psychiatric:  Unable to fully assess due to patient condition    LABORATORY PANEL:   CBC Recent Labs  Lab 09/30/18 1932  WBC 15.0*  HGB 15.9  HCT 46.2  PLT 176   ------------------------------------------------------------------------------------------------------------------  Chemistries  Recent Labs  Lab 09/30/18 1932  NA 140  K 3.9  CL 107  CO2 22  GLUCOSE 137*  BUN 18  CREATININE 1.36*  CALCIUM 9.3  AST 50*  ALT 57*  ALKPHOS 114  BILITOT 0.5   ------------------------------------------------------------------------------------------------------------------  Cardiac Enzymes No results for input(s): TROPONINI in the last 168 hours. ------------------------------------------------------------------------------------------------------------------  RADIOLOGY:  Ct Head Wo Contrast  Result Date: 09/30/2018 CLINICAL DATA:  48 year old male status post 1st seizure in 2 years EXAM: CT HEAD WITHOUT CONTRAST TECHNIQUE: Contiguous axial images were obtained from the base of the skull through the vertex without intravenous contrast. COMPARISON:  Head CTs 06/11/2007 and earlier. FINDINGS: Brain: Cerebral volume is stable and within normal limits. No midline shift, ventriculomegaly, mass effect, evidence  of mass lesion, intracranial hemorrhage or evidence of cortically based acute infarction. Gray-white matter differentiation is within normal limits throughout the brain. No encephalomalacia identified. Vascular: Mild Calcified atherosclerosis at the skull base. No suspicious intracranial vascular hyperdensity. Skull: No acute osseous abnormality identified. Congenital incomplete ossification of the posterior C1 ring. Sinuses/Orbits: Scattered mild mucosal thickening. No sinus fluid levels. Tympanic cavities and mastoids remain clear. Other: Visualized orbits and scalp soft tissues are within normal limits. IMPRESSION: Stable and normal noncontrast CT appearance of the brain. Electronically Signed   By: Odessa FlemingH  Hall M.D.   On: 09/30/2018 21:29    EKG:   Orders placed or performed during the hospital encounter of 09/30/18  . EKG 12-Lead  . EKG 12-Lead    IMPRESSION AND PLAN:  Principal Problem:   Seizure disorder (HCC) -home dose antiepileptics, additional dose of IV Keppra once tonight.  PRN Ativan for any further seizure activity.  Neurology  consult Active Problems:   HIV (human immunodeficiency virus infection) ([place]) -continue HAART therapy   Dyslipidemia -not on medication for this, heart healthy diet once patient is able to eat  Chart review performed and case discussed with ED provider. Labs, imaging and/or ECG reviewed by provider and discussed with patient/family. Management plans discussed with the patient and/or family.  COVID-19 status: Test pending  DVT PROPHYLAXIS: SubQ lovenox   GI PROPHYLAXIS:  None  ADMISSION STATUS: Observation  CODE STATUS: Full  TOTAL TIME TAKING CARE OF THIS PATIENT: 40 minutes.   This patient was evaluated in the context of the global COVID-19 pandemic, which necessitated consideration that the patient might be at risk for infection with the SARS-CoV-2 virus that causes COVID-19. Institutional protocols and algorithms that pertain to the evaluation of  patients at risk for COVID-19 are in a state of rapid change based on information released by regulatory bodies including the CDC and federal and state organizations. These policies and algorithms were followed to the best of this provider's knowledge to date during the patient's care at this facility.  Ethlyn Daniels 09/30/2018, 11:25 PM  [facility]  Office  [phone]  CC: Primary care physician; Patient, No Pcp Per  Note:  This document was prepared using Dragon voice recognition software and may include unintentional dictation errors.

## 2018-09-30 NOTE — ED Notes (Signed)
Admitting MD at bedside.

## 2018-09-30 NOTE — ED Notes (Signed)
ED TO INPATIENT HANDOFF REPORT  ED Nurse Name and Phone #: Rosalie GumsJenn I, [phone]  S Name/Age/Gender Jorge DuckingMichael W Holt 48 y.o. male Room/Bed: ED03A/ED03A  Code Status   Code Status: Not on file  Home/SNF/Other Home Patient oriented to: self, place, time and situation Is this baseline? Yes   Triage Complete: Triage complete  Chief Complaint Seizure  Triage Note Patient had his first seizure in 2 years tonight. He believes he has sinus infection. Patient's seizure occurred approximately 2 hours ago. Patient fell forward at about 1615hrs. Stroke screen with EMS was negative.   Allergies No Known Allergies  Level of Care/Admitting Diagnosis ED Disposition    ED Disposition Condition Comment   Admit  Hospital Area: [facility] [[id number]]  Level of Care: Med-Surg [16]  Covid Evaluation: Screening Protocol (No Symptoms)  Diagnosis: Seizure disorder [facility](HCC) [[id number]]) [306304]  Admitting Physician: Oralia ManisWILLIS, DAVID [[id number]]  Attending Physician: Oralia ManisWILLIS, DAVID [[id number]]  PT Class (Do Not Modify): Observation [104]  PT Acc Code (Do Not Modify): Observation [10022]       B Medical/Surgery History Past Medical History:  Diagnosis Date  . Allergy    Seasonal   . Epilepsy (HCC)   . Hepatitis B    history  . HIV (human immunodeficiency virus infection) (HCC)   . HIV infection (HCC)   . Personal history of Hodgkin's disease 04-03-1999  . Pseudomembranous enterocolitis    10-2007  . Seizures (HCC)    Past Surgical History:  Procedure Laterality Date  . BACK SURGERY  [id number]   lumbar surgery  2002  . LYMPH NODE BIOPSY  2001   axillary-dx hodgkins  . OPEN REDUCTION INTERNAL FIXATION (ORIF) DISTAL RADIAL FRACTURE  02/14/2012   Procedure: OPEN REDUCTION INTERNAL FIXATION (ORIF) DISTAL RADIAL FRACTURE;  Surgeon: Loreta Aveaniel F Murphy, MD;  Location: [facility];  Service: Orthopedics;  Laterality: Right;  . PORT-A-CATH REMOVAL  2001   put in and had out same yr for  hodgkins  . TONSILLECTOMY       A IV Location/Drains/Wounds Patient Lines/Drains/Airways Status   Active Line/Drains/Airways    Name:   Placement date:   Placement time:   Site:   Days:   Peripheral IV 09/30/18 Left Antecubital   09/30/18    1922    Antecubital   less than 1   Incision 02/14/12 Hand Right   02/14/12    1331     2420          Intake/Output Last 24 hours  Intake/Output Summary (Last 24 hours) at 09/30/2018 2335 Last data filed at 09/30/2018 2148 Gross per 24 hour  Intake 1000 ml  Output -  Net 1000 ml    Labs/Imaging Results for orders placed or performed during the hospital encounter of 09/30/18 (from the past 48 hour(s))  CBC with Differential     Status: Abnormal   Collection Time: 09/30/18  7:32 PM  Result Value Ref Range   WBC 15.0 (H) 4.0 - 10.5 K/uL   RBC 5.04 4.22 - 5.81 MIL/uL   Hemoglobin 15.9 13.0 - 17.0 g/dL   HCT 84.646.2 96.239.0 - 95.252.0 %   MCV 91.7 80.0 - 100.0 fL   MCH 31.5 26.0 - 34.0 pg   MCHC 34.4 30.0 - 36.0 g/dL   RDW 84.111.9 32.411.5 - 40.115.5 %   Platelets 176 150 - 400 K/uL   nRBC 0.0 0.0 - 0.2 %   Neutrophils Relative % 85 %   Neutro Abs 12.8 (H) 1.7 -  7.7 K/uL   Lymphocytes Relative 7 %   Lymphs Abs 1.0 0.7 - 4.0 K/uL   Monocytes Relative 7 %   Monocytes Absolute 1.1 (H) 0.1 - 1.0 K/uL   Eosinophils Relative 0 %   Eosinophils Absolute 0.0 0.0 - 0.5 K/uL   Basophils Relative 0 %   Basophils Absolute 0.0 0.0 - 0.1 K/uL   Immature Granulocytes 1 %   Abs Immature Granulocytes 0.08 (H) 0.00 - 0.07 K/uL    Comment: Performed at [facility], [address] [place], [place] [address]  Comprehensive metabolic panel     Status: Abnormal   Collection Time: 09/30/18  7:32 PM  Result Value Ref Range   Sodium 140 135 - 145 mmol/L   Potassium 3.9 3.5 - 5.1 mmol/L   Chloride 107 98 - 111 mmol/L   CO2 22 22 - 32 mmol/L   Glucose, Bld 137 (H) 70 - 99 mg/dL   BUN 18 6 - 20 mg/dL   Creatinine, Ser 6.041.36 (H) 0.61 - 1.24 mg/dL   Calcium 9.3  8.9 - 54.010.3 mg/dL   Total Protein 7.1 6.5 - 8.1 g/dL   Albumin 4.2 3.5 - 5.0 g/dL   AST 50 (H) 15 - 41 U/L   ALT 57 (H) 0 - 44 U/L   Alkaline Phosphatase 114 38 - 126 U/L   Total Bilirubin 0.5 0.3 - 1.2 mg/dL   GFR calc non Af Amer >60 >60 mL/min   GFR calc Af Amer >60 >60 mL/min   Anion gap 11 5 - 15    Comment: Performed at [facility], [address] [place], [place] [address]  Valproic acid level     Status: Abnormal   Collection Time: 09/30/18  7:32 PM  Result Value Ref Range   Valproic Acid Lvl <10 (L) 50.0 - 100.0 ug/mL    Comment: Performed at [facility], [address] [place], [place] [address]   Ct Head Wo Contrast  Result Date: 09/30/2018 CLINICAL DATA:  48 year old male status post 1st seizure in 2 years EXAM: CT HEAD WITHOUT CONTRAST TECHNIQUE: Contiguous axial images were obtained from the base of the skull through the vertex without intravenous contrast. COMPARISON:  Head CTs 06/11/2007 and earlier. FINDINGS: Brain: Cerebral volume is stable and within normal limits. No midline shift, ventriculomegaly, mass effect, evidence of mass lesion, intracranial hemorrhage or evidence of cortically based acute infarction. Gray-white matter differentiation is within normal limits throughout the brain. No encephalomalacia identified. Vascular: Mild Calcified atherosclerosis at the skull base. No suspicious intracranial vascular hyperdensity. Skull: No acute osseous abnormality identified. Congenital incomplete ossification of the posterior C1 ring. Sinuses/Orbits: Scattered mild mucosal thickening. No sinus fluid levels. Tympanic cavities and mastoids remain clear. Other: Visualized orbits and scalp soft tissues are within normal limits. IMPRESSION: Stable and normal noncontrast CT appearance of the brain. Electronically Signed   By: Odessa FlemingH  Hall M.D.   On: 09/30/2018 21:29    Pending Labs Unresulted Labs (From admission, onward)    Start     Ordered   09/30/18 2217   Novel Coronavirus,NAA,(SEND-OUT TO REF LAB - TAT 24-48 hrs); Hosp Order  (Symptomatic Patients Labs with Precautions )  Once,   STAT    Question:  Current symptoms  Answer:  Other (testing not indicated)   09/30/18 2216   09/30/18 2101  Urine Drug Screen, Qualitative ([facility] only)  Once,   STAT     09/30/18 2100   Signed and Held  CBC  (  enoxaparin (LOVENOX)    CrCl >/= 30 ml/min)  Once,   R    Comments: Baseline for enoxaparin therapy IF NOT ALREADY DRAWN.  Notify MD if PLT < 100 K.    Signed and Held   Signed and Held  Creatinine, serum  (enoxaparin (LOVENOX)    CrCl >/= 30 ml/min)  Once,   R    Comments: Baseline for enoxaparin therapy IF NOT ALREADY DRAWN.    Signed and Held   Signed and Held  Creatinine, serum  (enoxaparin (LOVENOX)    CrCl >/= 30 ml/min)  Weekly,   R    Comments: while on enoxaparin therapy    Signed and Held   Signed and Held  Basic metabolic panel  Tomorrow morning,   R     Signed and Held   Signed and Held  CBC  Tomorrow morning,   R     Signed and Held          Vitals/Pain Today's Vitals   09/30/18 2215 09/30/18 2230 09/30/18 2251 09/30/18 2330  BP:  129/90  (!) 135/96  Pulse: (!) 115 (!) 113  (!) 117  Resp: 19 (!) 21  (!) 23  Temp:      SpO2: 92% 93%  95%  Weight:      Height:      PainSc:   5      Isolation Precautions Airborne and Contact precautions  Medications Medications  senna-docusate (Senokot-S) tablet 1 tablet (has no administration in time range)  sodium chloride 0.9 % bolus 1,000 mL (0 mLs Intravenous Stopped 09/30/18 2148)  LORazepam (ATIVAN) injection 2 mg (2 mg Intravenous Given 09/30/18 2042)  acetaminophen (TYLENOL) tablet 1,000 mg (1,000 mg Oral Given 09/30/18 2252)    Mobility walks Moderate fall risk               R Recommendations: See Admitting Provider Note  Report given to:   Additional Notes: Patient had seizure while in ER as well, but is back to baseline other than being sleepy.

## 2018-10-01 ENCOUNTER — Other Ambulatory Visit: Payer: Self-pay

## 2018-10-01 ENCOUNTER — Observation Stay: Payer: BC Managed Care – PPO

## 2018-10-01 DIAGNOSIS — G40909 Epilepsy, unspecified, not intractable, without status epilepticus: Secondary | ICD-10-CM | POA: Diagnosis not present

## 2018-10-01 LAB — URINE DRUG SCREEN, QUALITATIVE (ARMC ONLY)
Amphetamines, Ur Screen: NOT DETECTED
Barbiturates, Ur Screen: NOT DETECTED
Benzodiazepine, Ur Scrn: NOT DETECTED
Cannabinoid 50 Ng, Ur ~~LOC~~: NOT DETECTED
Cocaine Metabolite,Ur ~~LOC~~: NOT DETECTED
MDMA (Ecstasy)Ur Screen: NOT DETECTED
Methadone Scn, Ur: NOT DETECTED
Opiate, Ur Screen: NOT DETECTED
Phencyclidine (PCP) Ur S: NOT DETECTED
Tricyclic, Ur Screen: NOT DETECTED

## 2018-10-01 LAB — CBC
HCT: 42.2 % (ref 39.0–52.0)
Hemoglobin: 14.6 g/dL (ref 13.0–17.0)
MCH: 31.6 pg (ref 26.0–34.0)
MCHC: 34.6 g/dL (ref 30.0–36.0)
MCV: 91.3 fL (ref 80.0–100.0)
Platelets: 153 10*3/uL (ref 150–400)
RBC: 4.62 MIL/uL (ref 4.22–5.81)
RDW: 11.9 % (ref 11.5–15.5)
WBC: 12.4 10*3/uL — ABNORMAL HIGH (ref 4.0–10.5)
nRBC: 0 % (ref 0.0–0.2)

## 2018-10-01 LAB — BASIC METABOLIC PANEL
Anion gap: 8 (ref 5–15)
BUN: 18 mg/dL (ref 6–20)
CO2: 21 mmol/L — ABNORMAL LOW (ref 22–32)
Calcium: 8.6 mg/dL — ABNORMAL LOW (ref 8.9–10.3)
Chloride: 112 mmol/L — ABNORMAL HIGH (ref 98–111)
Creatinine, Ser: 1.22 mg/dL (ref 0.61–1.24)
GFR calc Af Amer: >60 mL/min (ref >60)
GFR calc non Af Amer: >60 mL/min (ref >60)
Glucose, Bld: 110 mg/dL — ABNORMAL HIGH (ref 70–99)
Potassium: 3.9 mmol/L (ref 3.5–5.1)
Sodium: 141 mmol/L (ref 135–145)

## 2018-10-01 MED ORDER — ENOXAPARIN SODIUM 40 MG/0.4ML ~~LOC~~ SOLN
40.0000 mg | SUBCUTANEOUS | Status: DC
  Administered 2018-10-01 – 2018-10-02 (×2): 40 mg via SUBCUTANEOUS
  Filled 2018-10-01 (×2): qty 0.4

## 2018-10-01 MED ORDER — MAGIC MOUTHWASH
5.0000 mL | Freq: Four times a day (QID) | ORAL | Status: DC | PRN
  Administered 2018-10-01: 5 mL via ORAL
  Filled 2018-10-01: qty 5

## 2018-10-01 MED ORDER — DIVALPROEX SODIUM 250 MG PO DR TAB
250.0000 mg | DELAYED_RELEASE_TABLET | ORAL | Status: DC
  Filled 2018-10-01: qty 1

## 2018-10-01 MED ORDER — ACETAMINOPHEN 650 MG RE SUPP: 650.0000 mg | Freq: Four times a day (QID) | RECTAL | Status: DC | PRN

## 2018-10-01 MED ORDER — MAGIC MOUTHWASH W/LIDOCAINE: 5.0000 mL | Freq: Four times a day (QID) | ORAL | Status: DC | PRN

## 2018-10-01 MED ORDER — LEVETIRACETAM 750 MG PO TABS
1250.0000 mg | ORAL_TABLET | Freq: Two times a day (BID) | ORAL | Status: DC
  Administered 2018-10-01 – 2018-10-02 (×2): 1250 mg via ORAL
  Filled 2018-10-01 (×3): qty 1

## 2018-10-01 MED ORDER — DIVALPROEX SODIUM 500 MG PO DR TAB
500.0000 mg | DELAYED_RELEASE_TABLET | Freq: Every day | ORAL | Status: DC
  Administered 2018-10-01: 500 mg via ORAL
  Filled 2018-10-01 (×2): qty 1

## 2018-10-01 MED ORDER — ONDANSETRON HCL 4 MG PO TABS: 4.0000 mg | ORAL_TABLET | Freq: Four times a day (QID) | ORAL | Status: DC | PRN

## 2018-10-01 MED ORDER — EFAVIRENZ-EMTRICITAB-TENOFOVIR 600-200-300 MG PO TABS
1.0000 | ORAL_TABLET | Freq: Every day | ORAL | Status: DC
  Administered 2018-10-02: 06:00:00 1 via ORAL
  Filled 2018-10-01 (×2): qty 1

## 2018-10-01 MED ORDER — ACETAMINOPHEN 325 MG PO TABS
650.0000 mg | ORAL_TABLET | Freq: Four times a day (QID) | ORAL | Status: DC | PRN
  Administered 2018-10-01: 650 mg via ORAL
  Filled 2018-10-01: qty 2

## 2018-10-01 MED ORDER — LEVETIRACETAM IN NACL 500 MG/100ML IV SOLN
500.0000 mg | Freq: Once | INTRAVENOUS | Status: AC
  Administered 2018-10-01: 02:00:00 500 mg via INTRAVENOUS
  Filled 2018-10-01: qty 100

## 2018-10-01 MED ORDER — LORAZEPAM 2 MG/ML IJ SOLN: 2.0000 mg | INTRAMUSCULAR | Status: DC | PRN

## 2018-10-01 MED ORDER — SODIUM CHLORIDE 0.9 % IV SOLN
3.0000 g | Freq: Four times a day (QID) | INTRAVENOUS | Status: DC
  Administered 2018-10-01 – 2018-10-02 (×5): 3 g via INTRAVENOUS
  Filled 2018-10-01: qty 8
  Filled 2018-10-01 (×3): qty 3
  Filled 2018-10-01: qty 8
  Filled 2018-10-01 (×2): qty 3
  Filled 2018-10-01: qty 8

## 2018-10-01 MED ORDER — SODIUM CHLORIDE 0.9 % IV SOLN
INTRAVENOUS | Status: DC | PRN
  Administered 2018-10-01 – 2018-10-02 (×2): 250 mL via INTRAVENOUS

## 2018-10-01 MED ORDER — LIDOCAINE VISCOUS HCL 2 % MT SOLN
15.0000 mL | Freq: Four times a day (QID) | OROMUCOSAL | Status: DC | PRN
  Filled 2018-10-01 (×2): qty 15

## 2018-10-01 MED ORDER — ONDANSETRON HCL 4 MG/2ML IJ SOLN
4.0000 mg | Freq: Four times a day (QID) | INTRAMUSCULAR | Status: DC | PRN
  Administered 2018-10-01: 01:00:00 4 mg via INTRAVENOUS
  Filled 2018-10-01: qty 2

## 2018-10-01 MED ORDER — EFAVIRENZ-EMTRICITAB-TENOFOVIR 600-200-300 MG PO TABS
1.0000 | ORAL_TABLET | Freq: Every day | ORAL | Status: DC
  Filled 2018-10-01: qty 1

## 2018-10-01 MED ORDER — SODIUM CHLORIDE 0.9 % IV SOLN
1250.0000 mg | Freq: Once | INTRAVENOUS | Status: AC
  Administered 2018-10-01: 1250 mg via INTRAVENOUS
  Filled 2018-10-01: qty 12.5

## 2018-10-01 NOTE — Discharge Instructions (Signed)
Patient unable to drive, operate heavy machinery, perform activities at heights and participate in water activities until release by outpatient physician.

## 2018-10-01 NOTE — ED Notes (Signed)
Patient c/o congestion in chest. Dr. Jannifer Franklin notified. CXR to be performed.

## 2018-10-01 NOTE — Progress Notes (Signed)
[facility] [place] [facility]   PATIENT NAME: Jorge Holt    MR#:  [id number]  DATE OF BIRTH:  1970/09/08  SUBJECTIVE:  CHIEF COMPLAINT:   Chief Complaint  Patient presents with  . Seizures   The patient has no seizure.  But he complains of cough and shortness of breath. REVIEW OF SYSTEMS:  Review of Systems  Constitutional: Negative for chills, fever and malaise/fatigue.  HENT: Negative for sore throat.   Eyes: Negative for blurred vision and double vision.  Respiratory: Positive for cough, sputum production and shortness of breath. Negative for hemoptysis, wheezing and stridor.   Cardiovascular: Negative for chest pain, palpitations, orthopnea and leg swelling.  Gastrointestinal: Negative for abdominal pain, blood in stool, diarrhea, melena, nausea and vomiting.  Genitourinary: Negative for dysuria, flank pain and hematuria.  Musculoskeletal: Negative for back pain and joint pain.  Skin: Negative for rash.  Neurological: Negative for dizziness, sensory change, focal weakness, seizures, loss of consciousness, weakness and headaches.  Endo/Heme/Allergies: Negative for polydipsia.  Psychiatric/Behavioral: Negative for depression. The patient is not nervous/anxious.     DRUG ALLERGIES:  No Known Allergies VITALS:  Blood pressure 116/79, pulse (!) 106, temperature 99 F (37.2 C), resp. rate 18, height 5\' 8"  (1.727 m), weight 74.8 kg, SpO2 98 %. PHYSICAL EXAMINATION:  Physical Exam HENT:     Head: Normocephalic.     Mouth/Throat:     Mouth: Mucous membranes are moist.  Eyes:     General: No scleral icterus.    Conjunctiva/sclera: Conjunctivae normal.     Pupils: Pupils are equal, round, and reactive to light.  Neck:     Musculoskeletal: Normal range of motion and neck supple.     Vascular: No JVD.     Trachea: No tracheal deviation.  Cardiovascular:     Rate and Rhythm: Normal rate and regular rhythm.     Heart sounds: Normal heart sounds.  No murmur. No gallop.   Pulmonary:     Effort: Pulmonary effort is normal. No respiratory distress.     Breath sounds: Normal breath sounds. No wheezing or rales.  Abdominal:     General: Bowel sounds are normal. There is no distension.     Palpations: Abdomen is soft.     Tenderness: There is no abdominal tenderness. There is no rebound.  Musculoskeletal: Normal range of motion.        General: No tenderness.     Right lower leg: No edema.     Left lower leg: No edema.  Skin:    Findings: No erythema or rash.  Neurological:     General: No focal deficit present.     Mental Status: He is alert and oriented to person, place, and time.     Cranial Nerves: No cranial nerve deficit.  Psychiatric:        Mood and Affect: Mood normal.    LABORATORY PANEL:  Male CBC Recent Labs  Lab 10/01/18 0407  WBC 12.4*  HGB 14.6  HCT 42.2  PLT 153   ------------------------------------------------------------------------------------------------------------------ Chemistries  Recent Labs  Lab 09/30/18 1932 10/01/18 0407  NA 140 141  K 3.9 3.9  CL 107 112*  CO2 22 21*  GLUCOSE 137* 110*  BUN 18 18  CREATININE 1.36* 1.22  CALCIUM 9.3 8.6*  AST 50*  --   ALT 57*  --   ALKPHOS 114  --   BILITOT 0.5  --    RADIOLOGY:  Ct Head Wo Contrast  Result Date: 09/30/2018 CLINICAL DATA:  48 year old male status post 1st seizure in 2 years EXAM: CT HEAD WITHOUT CONTRAST TECHNIQUE: Contiguous axial images were obtained from the base of the skull through the vertex without intravenous contrast. COMPARISON:  Head CTs 06/11/2007 and earlier. FINDINGS: Brain: Cerebral volume is stable and within normal limits. No midline shift, ventriculomegaly, mass effect, evidence of mass lesion, intracranial hemorrhage or evidence of cortically based acute infarction. Gray-white matter differentiation is within normal limits throughout the brain. No encephalomalacia identified. Vascular: Mild Calcified  atherosclerosis at the skull base. No suspicious intracranial vascular hyperdensity. Skull: No acute osseous abnormality identified. Congenital incomplete ossification of the posterior C1 ring. Sinuses/Orbits: Scattered mild mucosal thickening. No sinus fluid levels. Tympanic cavities and mastoids remain clear. Other: Visualized orbits and scalp soft tissues are within normal limits. IMPRESSION: Stable and normal noncontrast CT appearance of the brain. Electronically Signed   By: Genevie Ann M.D.   On: 09/30/2018 21:29   Dg Chest Port 1 View  Result Date: 10/01/2018 CLINICAL DATA:  Cough EXAM: PORTABLE CHEST 1 VIEW COMPARISON:  02/28/2004 FINDINGS: Heart is normal size. Airspace opacity at the right lung base could reflect atelectasis or developing infiltrate. Left lung clear. No effusions or acute bony abnormality. IMPRESSION: Airspace opacity at the right lung base could reflect atelectasis or developing infiltrate/pneumonia. Electronically Signed   By: Rolm Baptise M.D.   On: 10/01/2018 00:35   ASSESSMENT AND PLAN:   Seizure activity. Per Dr. Doy Mince, Keppra 1250mg  IV now with maintenance of 1250mg  BID after initiation.  Discontinued Depakote. Patient unable to drive, operate heavy machinery, perform activities at heights and participate in water activities until release by outpatient physician.  Aspiration pneumonia with leukocytosis.  Start Unasyn IV and follow-up CBC.   HIV (human immunodeficiency virus infection) ([place]) -continue HAART therapy   Dyslipidemia -not on medication for this, heart healthy diet.  I discussed with Dr. Doy Mince. All the records are reviewed and case discussed with Care Management/Social Worker. Management plans discussed with the patient, family and they are in agreement.  CODE STATUS: Full Code  TOTAL TIME TAKING CARE OF THIS PATIENT: 27 minutes.   More than 50% of the time was spent in counseling/coordination of care: YES  POSSIBLE D/C IN 2 DAYS, DEPENDING ON  CLINICAL CONDITION.   Demetrios Loll M.D on 10/01/2018 at 11:16 AM  Between 7am to 6pm - Pager - [phone]  After 6pm go to [email or web address] - [facility]

## 2018-10-01 NOTE — Consult Note (Signed)
Reason for Consult:Seizure Referring Physician: Imogene Burnhen  CC: Seizure disorder  HPI: Jacqulyn DuckingMichael W Eicher is an 48 y.o. male with a history of seizures since the age of 48.  Patient reports that he was previously on Depakote but was changed to Keppra at 1000mg  BID.  He has been seizure free for about a year.  On yesterday had a seizure prior to admission.  Was noted to have one in the ED as well.    Past Medical History:  Diagnosis Date  . Allergy    Seasonal   . Epilepsy (HCC)   . Hepatitis B    history  . HIV (human immunodeficiency virus infection) (HCC)   . HIV infection (HCC)   . Personal history of Hodgkin's disease 04-03-1999  . Pseudomembranous enterocolitis    10-2007  . Seizures (HCC)     Past Surgical History:  Procedure Laterality Date  . BACK SURGERY  [id number]   lumbar surgery  2002  . LYMPH NODE BIOPSY  2001   axillary-dx hodgkins  . OPEN REDUCTION INTERNAL FIXATION (ORIF) DISTAL RADIAL FRACTURE  02/14/2012   Procedure: OPEN REDUCTION INTERNAL FIXATION (ORIF) DISTAL RADIAL FRACTURE;  Surgeon: Loreta Aveaniel F Murphy, MD;  Location: [facility];  Service: Orthopedics;  Laterality: Right;  . PORT-A-CATH REMOVAL  2001   put in and had out same yr for hodgkins  . TONSILLECTOMY      Family History  Problem Relation Age of Onset  . COPD Father     Social History:  reports that he has quit smoking. He quit smokeless tobacco use about 10 years ago. He reports current alcohol use of about 1.0 standard drinks of alcohol per week. He reports that he does not use drugs.  No Known Allergies  Medications:  I have reviewed the patient's current medications. Prior to Admission:  Medications Prior to Admission  Medication Sig Dispense Refill Last Dose  . albuterol (VENTOLIN HFA) 108 (90 Base) MCG/ACT inhaler Inhale 2 puffs into the lungs every 6 (six) hours as needed.     Marland Kitchen. efavirenz-emtricitabine-tenofovir (ATRIPLA) 600-200-300 MG per tablet Take 1 tablet by mouth daily. 30  tablet 11   . levETIRAcetam (KEPPRA) 1000 MG tablet Take 1,000 mg by mouth every 12 (twelve) hours.     Marland Kitchen. VYVANSE 20 MG capsule Take 20 mg by mouth 2 (two) times daily.     . divalproex (DEPAKOTE) 250 MG EC tablet Take 500 mg by mouth at bedtime.      . divalproex (DEPAKOTE) 250 MG EC tablet Take 250 mg by mouth every morning.      . fexofenadine (ALLEGRA) 180 MG tablet Take 180 mg by mouth daily.        Scheduled: . efavirenz-emtricitabine-tenofovir  1 tablet Oral Daily  . enoxaparin (LOVENOX) injection  40 mg Subcutaneous Q24H  . levETIRAcetam  1,250 mg Oral BID    ROS: History obtained from the patient  General ROS: negative for - chills, fatigue, fever, night sweats, weight gain or weight loss Psychological ROS: negative for - behavioral disorder, hallucinations, memory difficulties, mood swings or suicidal ideation Ophthalmic ROS: negative for - blurry vision, double vision, eye pain or loss of vision ENT ROS: negative for - epistaxis, nasal discharge, oral lesions, sore throat, tinnitus or vertigo Allergy and Immunology ROS: negative for - hives or itchy/watery eyes Hematological and Lymphatic ROS: negative for - bleeding problems, bruising or swollen lymph nodes Endocrine ROS: negative for - galactorrhea, hair pattern changes, polydipsia/polyuria or temperature intolerance  Respiratory ROS: negative for - cough, hemoptysis, shortness of breath or wheezing Cardiovascular ROS: negative for - chest pain, dyspnea on exertion, edema or irregular heartbeat Gastrointestinal ROS: negative for - abdominal pain, diarrhea, hematemesis, nausea/vomiting or stool incontinence Genito-Urinary ROS: negative for - dysuria, hematuria, incontinence or urinary frequency/urgency Musculoskeletal ROS: negative for - joint swelling or muscular weakness Neurological ROS: as noted in HPI Dermatological ROS: negative for rash and skin lesion changes  Physical Examination: Blood pressure 116/79, pulse (!)  106, temperature 99 F (37.2 C), resp. rate 18, height 5\' 8"  (1.727 m), weight 74.8 kg, SpO2 98 %.  HEENT-  Normocephalic, no lesions, without obvious abnormality.  Normal external eye and conjunctiva.  Normal TM's bilaterally.  Normal auditory canals and external ears. Normal external nose, mucus membranes and septum.  Normal pharynx. Cardiovascular- S1, S2 normal, pulses palpable throughout   Lungs- chest clear, no wheezing, rales, normal symmetric air entry Abdomen- soft, non-tender; bowel sounds normal; no masses,  no organomegaly Extremities- no edema Lymph-no adenopathy palpable Musculoskeletal-no joint tenderness, deformity or swelling Skin-warm and dry, no hyperpigmentation, vitiligo, or suspicious lesions  Neurological Examination   Mental Status: Alert, oriented, thought content appropriate.  Speech fluent without evidence of aphasia.  Able to follow 3 step commands without difficulty. Cranial Nerves: II: Discs flat bilaterally; Visual fields grossly normal, pupils equal, round, reactive to light and accommodation III,IV, VI: ptosis not present, extra-ocular motions intact bilaterally V,VII: smile symmetric, facial light touch sensation normal bilaterally VIII: hearing normal bilaterally IX,X: gag reflex present XI: bilateral shoulder shrug XII: midline tongue extension Motor: Right : Upper extremity   5/5    Left:     Upper extremity   5/5  Lower extremity   5/5     Lower extremity   5/5 Tone and bulk:normal tone throughout; no atrophy noted Sensory: Pinprick and light touch intact throughout, bilaterally Deep Tendon Reflexes: Trace in the upper extremities, absent in the lower extremities Plantars: Right: mute   Left: mute Cerebellar: Normal finger-to-nose and normal heel-to-shin testing bilaterally Gait: not tested due to safety concerns    Laboratory Studies:   Basic Metabolic Panel: Recent Labs  Lab 09/30/18 1932 10/01/18 0407  NA 140 141  K 3.9 3.9  CL 107  112*  CO2 22 21*  GLUCOSE 137* 110*  BUN 18 18  CREATININE 1.36* 1.22  CALCIUM 9.3 8.6*    Liver Function Tests: Recent Labs  Lab 09/30/18 1932  AST 50*  ALT 57*  ALKPHOS 114  BILITOT 0.5  PROT 7.1  ALBUMIN 4.2   No results for input(s): LIPASE, AMYLASE in the last 168 hours. No results for input(s): AMMONIA in the last 168 hours.  CBC: Recent Labs  Lab 09/30/18 1932 10/01/18 0407  WBC 15.0* 12.4*  NEUTROABS 12.8*  --   HGB 15.9 14.6  HCT 46.2 42.2  MCV 91.7 91.3  PLT 176 153    Cardiac Enzymes: No results for input(s): CKTOTAL, CKMB, CKMBINDEX, TROPONINI in the last 168 hours.  BNP: Invalid input(s): POCBNP  CBG: No results for input(s): GLUCAP in the last 168 hours.  Microbiology: No results found for this or any previous visit.  Coagulation Studies: No results for input(s): LABPROT, INR in the last 72 hours.  Urinalysis: No results for input(s): COLORURINE, LABSPEC, PHURINE, GLUCOSEU, HGBUR, BILIRUBINUR, KETONESUR, PROTEINUR, UROBILINOGEN, NITRITE, LEUKOCYTESUR in the last 168 hours.  Invalid input(s): APPERANCEUR  Lipid Panel:     Component Value Date/Time   CHOL 175 03/21/2011 1405  TRIG 85 03/21/2011 1405   HDL 46 03/21/2011 1405   CHOLHDL 3.8 03/21/2011 1405   VLDL 17 03/21/2011 1405   LDLCALC 112 (H) 03/21/2011 1405    HgbA1C: No results found for: HGBA1C  Urine Drug Screen:      Component Value Date/Time   LABOPIA NONE DETECTED 09/30/2018 2343   LABOPIA NONE DETECTED 03/03/2007 1014   COCAINSCRNUR NONE DETECTED 09/30/2018 2343   LABBENZ NONE DETECTED 09/30/2018 2343   LABBENZ NONE DETECTED 03/03/2007 1014   AMPHETMU NONE DETECTED 09/30/2018 2343   AMPHETMU NONE DETECTED 03/03/2007 1014   THCU NONE DETECTED 09/30/2018 2343   THCU NONE DETECTED 03/03/2007 1014   LABBARB NONE DETECTED 09/30/2018 2343   LABBARB  03/03/2007 1014    NONE DETECTED        DRUG SCREEN FOR MEDICAL PURPOSES ONLY.  IF CONFIRMATION IS NEEDED FOR ANY  PURPOSE, NOTIFY LAB WITHIN 5 DAYS.    Alcohol Level: No results for input(s): ETH in the last 168 hours.  Other results: EKG: sinus tachycardia at 109 bpm.  Imaging: Ct Head Wo Contrast  Result Date: 09/30/2018 CLINICAL DATA:  48 year old male status post 1st seizure in 2 years EXAM: CT HEAD WITHOUT CONTRAST TECHNIQUE: Contiguous axial images were obtained from the base of the skull through the vertex without intravenous contrast. COMPARISON:  Head CTs 06/11/2007 and earlier. FINDINGS: Brain: Cerebral volume is stable and within normal limits. No midline shift, ventriculomegaly, mass effect, evidence of mass lesion, intracranial hemorrhage or evidence of cortically based acute infarction. Gray-white matter differentiation is within normal limits throughout the brain. No encephalomalacia identified. Vascular: Mild Calcified atherosclerosis at the skull base. No suspicious intracranial vascular hyperdensity. Skull: No acute osseous abnormality identified. Congenital incomplete ossification of the posterior C1 ring. Sinuses/Orbits: Scattered mild mucosal thickening. No sinus fluid levels. Tympanic cavities and mastoids remain clear. Other: Visualized orbits and scalp soft tissues are within normal limits. IMPRESSION: Stable and normal noncontrast CT appearance of the brain. Electronically Signed   By: Odessa FlemingH  Hall M.D.   On: 09/30/2018 21:29   Dg Chest Port 1 View  Result Date: 10/01/2018 CLINICAL DATA:  Cough EXAM: PORTABLE CHEST 1 VIEW COMPARISON:  02/28/2004 FINDINGS: Heart is normal size. Airspace opacity at the right lung base could reflect atelectasis or developing infiltrate. Left lung clear. No effusions or acute bony abnormality. IMPRESSION: Airspace opacity at the right lung base could reflect atelectasis or developing infiltrate/pneumonia. Electronically Signed   By: Charlett NoseKevin  Dover M.D.   On: 10/01/2018 00:35     Assessment/Plan: 48 year old male with a history of seizure disorder presenting with  breakthrough seizures.  Patient on Keppra at home at 1000mg  BID.  No longer taking Depakote.  Depakote level less than 10 on presentation.  Head CT reviewed and shows no acute changes.    Recommendations: 1. D/C Depakote 2. Keppra 1250mg  IV now with maintenance of 1250mg  BID after initiation. 3. Seizure precautions 4. Patient unable to drive, operate heavy machinery, perform activities at heights and participate in water activities until release by outpatient physician.  Case discussed with Dr. Duane Lopehen  Robb Sibal, MD Neurology [phone] 10/01/2018, 10:28 AM

## 2018-10-01 NOTE — Plan of Care (Signed)
Pt a&o. VSS. No seizure activity during the shift.  Pt tongue bitten and swollen, unable to eat. MD notified and ordered prn meds for mouth pain.

## 2018-10-01 NOTE — Progress Notes (Signed)
Pharmacy Antibiotic Note  Jorge Holt is a 48 y.o. male admitted on 09/30/2018 with seizures.  Pharmacy has been consulted for Zosyn dosing for aspiration pneumonia. After discussion with MD, will do Unasyn as Zosyn may increase risk of seizure activity in patients with a seizure disorder.  Plan: Unasyn 3 g IV q6h  Height: 5\' 8"  (172.7 cm) Weight: 165 lb (74.8 kg) IBW/kg (Calculated) : 68.4  Temp (24hrs), Avg:98.8 F (37.1 C), Min:98.5 F (36.9 C), Max:99 F (37.2 C)  Recent Labs  Lab 09/30/18 1932 10/01/18 0407  WBC 15.0* 12.4*  CREATININE 1.36* 1.22    Estimated Creatinine Clearance: 71.6 mL/min (by C-G formula based on SCr of 1.22 mg/dL).    No Known Allergies  Antimicrobials this admission: Unasyn 7/1 >>  Dose adjustments this admission: NA  Microbiology results:   Thank you for allowing pharmacy to be a part of this patient's care.  Tawnya Crook, PharmD Clinical Pharmacist 10/01/2018 8:22 AM

## 2018-10-02 LAB — CBC
HCT: 42.5 % (ref 39.0–52.0)
Hemoglobin: 14.5 g/dL (ref 13.0–17.0)
MCH: 31.6 pg (ref 26.0–34.0)
MCHC: 34.1 g/dL (ref 30.0–36.0)
MCV: 92.6 fL (ref 80.0–100.0)
Platelets: 148 10*3/uL — ABNORMAL LOW (ref 150–400)
RBC: 4.59 MIL/uL (ref 4.22–5.81)
RDW: 11.6 % (ref 11.5–15.5)
WBC: 10.7 10*3/uL — ABNORMAL HIGH (ref 4.0–10.5)
nRBC: 0 % (ref 0.0–0.2)

## 2018-10-02 MED ORDER — LIDOCAINE VISCOUS HCL 2 % MT SOLN: 15.0000 mL | Freq: Four times a day (QID) | OROMUCOSAL | 0 refills | Status: AC | PRN

## 2018-10-02 MED ORDER — AMOXICILLIN-POT CLAVULANATE 875-125 MG PO TABS: 1.0000 | ORAL_TABLET | Freq: Two times a day (BID) | ORAL | 0 refills | Status: AC

## 2018-10-02 MED ORDER — LEVETIRACETAM 250 MG PO TABS: 1250.0000 mg | ORAL_TABLET | Freq: Two times a day (BID) | ORAL | 0 refills | Status: AC

## 2018-10-02 MED ORDER — MAGIC MOUTHWASH: 5.0000 mL | Freq: Four times a day (QID) | ORAL | 0 refills | Status: AC | PRN

## 2018-10-02 MED ORDER — AMOXICILLIN-POT CLAVULANATE 875-125 MG PO TABS: 1.0000 | ORAL_TABLET | Freq: Two times a day (BID) | ORAL | Status: DC

## 2018-10-02 NOTE — Discharge Summary (Signed)
[facility] NAME: Jorge Holt    MR#:  [id number]  DATE OF BIRTH:  [birth year]  DATE OF ADMISSION:  09/30/2018   ADMITTING PHYSICIAN: Lance Coon, MD  DATE OF DISCHARGE: 10/02/2018  PRIMARY CARE PHYSICIAN: Jorge Holt, No Pcp Per   ADMISSION DIAGNOSIS:  Cough [R05] Seizure (Jorge Holt) [R56.9] DISCHARGE DIAGNOSIS:  Principal Problem:   Seizure disorder (Jorge Holt) Active Problems:   HIV (human immunodeficiency virus infection) ([place])   Dyslipidemia  SECONDARY DIAGNOSIS:   Past Medical History:  Diagnosis Date  . Allergy    Seasonal   . Epilepsy (Jorge Holt)   . Hepatitis B    history  . HIV (human immunodeficiency virus infection) (Jorge Holt)   . HIV infection (Jorge Holt)   . Personal history of Hodgkin's disease 04-03-1999  . Pseudomembranous enterocolitis    10-2007  . Seizures ([place])    HOSPITAL COURSE:   Seizure activity. Per Dr. Doy Mince, Keppra 1250mg  IV given with maintenance of 1250mg  BID after initiation.  Discontinued Depakote. Jorge Holt unable to drive, operate heavy machinery, perform activities at heights and participate in water activities until release by outpatient physician.  Aspiration pneumonia with leukocytosis.  Started Unasyn IV and leukocytosis improved.  Change to p.o. Augmentin. Acute renal failure with dehydration improved. HIV (human immunodeficiency virus infection) (Jorge Holt) -continue HAART therapy Dyslipidemia -not on medication for this, heart healthy diet.  DISCHARGE CONDITIONS:   CONSULTS OBTAINED:  Treatment Team:  Catarina Hartshorn, MD Alexis Goodell, MD DRUG ALLERGIES:  No Known Allergies DISCHARGE MEDICATIONS:   Allergies as of 10/02/2018   No Known Allergies     Medication List    TAKE these medications   albuterol 108 (90 Base) MCG/ACT inhaler Commonly known as: VENTOLIN HFA Inhale 2 puffs into the lungs every 6 (six) hours as needed.   amoxicillin-clavulanate 875-125 MG tablet Commonly known  as: AUGMENTIN Take 1 tablet by mouth every 12 (twelve) hours.   efavirenz-emtricitabine-tenofovir 600-200-300 MG tablet Commonly known as: Atripla Take 1 tablet by mouth daily.   levETIRAcetam 250 MG tablet Commonly known as: KEPPRA Take 5 tablets (1,250 mg total) by mouth 2 (two) times daily. What changed:   medication strength  how much to take  when to take this   lidocaine 2 % solution Commonly known as: XYLOCAINE Use as directed 15 mLs in the mouth or throat 4 (four) times daily as needed for mouth pain.   magic mouthwash Soln Take 5 mLs by mouth 4 (four) times daily as needed for mouth pain.   Vyvanse 20 MG capsule Generic drug: lisdexamfetamine Take 20 mg by mouth 2 (two) times daily.        DISCHARGE INSTRUCTIONS:  See AVS.  If you experience worsening of your admission symptoms, develop shortness of breath, life threatening emergency, suicidal or homicidal thoughts you must seek medical attention immediately by calling 911 or calling your MD immediately  if symptoms less severe.  You Must read complete instructions/literature along with all the possible adverse reactions/side effects for all the Medicines you take and that have been prescribed to you. Take any new Medicines after you have completely understood and accpet all the possible adverse reactions/side effects.   Please note  You were cared for by a hospitalist during your hospital stay. If you have any questions about your discharge medications or the care you received while you were in the hospital after you are discharged, you can call the unit and asked to speak with the  hospitalist on call if the hospitalist that took care of you is not available. Once you are discharged, your primary care physician will handle any further medical issues. Please note that NO REFILLS for any discharge medications will be authorized once you are discharged, as it is imperative that you return to your primary care  physician (or establish a relationship with a primary care physician if you do not have one) for your aftercare needs so that they can reassess your need for medications and monitor your lab values.    On the day of Discharge:  VITAL SIGNS:  Blood pressure 123/73, pulse (!) 105, temperature 98.9 F (37.2 C), temperature source Oral, resp. rate 16, height 5\' 8"  (1.727 m), weight 74.8 kg, SpO2 94 %. PHYSICAL EXAMINATION:  GENERAL:  48 y.o.-year-old Jorge Holt lying in the bed with no acute distress.  EYES: Pupils equal, round, reactive to light and accommodation. No scleral icterus. Extraocular muscles intact.  HEENT: Head atraumatic, normocephalic. Oropharynx and nasopharynx clear.  NECK:  Supple, no jugular venous distention. No thyroid enlargement, no tenderness.  LUNGS: Normal breath sounds bilaterally, no wheezing, rales,rhonchi or crepitation. No use of accessory muscles of respiration.  CARDIOVASCULAR: S1, S2 normal. No murmurs, rubs, or gallops.  ABDOMEN: Soft, non-tender, non-distended. Bowel sounds present. No organomegaly or mass.  EXTREMITIES: No pedal edema, cyanosis, or clubbing.  NEUROLOGIC: Cranial nerves II through XII are intact. Muscle strength 5/5 in all extremities. Sensation intact. Gait not checked.  PSYCHIATRIC: The Jorge Holt is alert and oriented x 3.  SKIN: No obvious rash, lesion, or ulcer.  DATA REVIEW:   CBC Recent Labs  Lab 10/02/18 0344  WBC 10.7*  HGB 14.5  HCT 42.5  PLT 148*    Chemistries  Recent Labs  Lab 09/30/18 1932 10/01/18 0407  NA 140 141  K 3.9 3.9  CL 107 112*  CO2 22 21*  GLUCOSE 137* 110*  BUN 18 18  CREATININE 1.36* 1.22  CALCIUM 9.3 8.6*  AST 50*  --   ALT 57*  --   ALKPHOS 114  --   BILITOT 0.5  --      Microbiology Results  No results found for this or any previous visit.  RADIOLOGY:  No results found.   Management plans discussed with the Jorge Holt, family and they are in agreement.  CODE STATUS: Full Code   TOTAL  TIME TAKING CARE OF THIS Jorge Holt: 25 minutes.    Shaune PollackQing Shakya Sebring M.D on 10/02/2018 at 11:43 AM  Between 7am to 6pm - Pager - [phone]  After 6pm go to [email or web address] - Social research officer, governmentpassword EPAS [facility]  [facility]  Office  [phone]  CC: Primary care physician; Jorge Holt, No Pcp Per   Note: This dictation was prepared with Dragon dictation along with smaller phrase technology. Any transcriptional errors that result from this process are unintentional.

## 2018-10-02 NOTE — Progress Notes (Signed)
Pt's ride present for discharge; pt discharged via wheelchair by nursing to [facility] 

## 2018-10-02 NOTE — Progress Notes (Signed)
MD order received to discharge pt home today; verbally reviewed AVS with pt, gave Rxs to pt; no questions voiced at this time; pt's discharge pending arrival of his ride home, verbally instructed pt to have his ride come to the [facility] entrance for discharge and call him when they arrive in order to take him to the [facility] entrance for discharge

## 2018-10-03 LAB — NOVEL CORONAVIRUS, NAA (HOSP ORDER, SEND-OUT TO REF LAB; TAT 18-24 HRS): SARS-CoV-2, NAA: NOT DETECTED

## 2019-06-25 ENCOUNTER — Ambulatory Visit: Payer: BC Managed Care – PPO | Attending: Internal Medicine

## 2019-06-25 DIAGNOSIS — Z23 Encounter for immunization: Secondary | ICD-10-CM

## 2019-06-25 NOTE — Progress Notes (Signed)
   Covid-19 Vaccination Clinic  Name:  Jorge Holt    MRN: [id number] DOB: Feb 17, 1971  06/25/2019  Mr. [name] was observed post Covid-19 immunization for 15 minutes without incident. He was provided with Vaccine Information Sheet and instruction to access the V-Safe system.   Mr. [name] was instructed to call 911 with any severe reactions post vaccine: Marland Kitchen Difficulty breathing  . Swelling of face and throat  . A fast heartbeat  . A bad rash all over body  . Dizziness and weakness   Immunizations Administered    Name Date Dose VIS Date Route   Pfizer COVID-19 Vaccine 06/25/2019  1:13 PM 0.3 mL 03/13/2019 Intramuscular   Manufacturer: [employer], [employer]   Lot: [id number]   NDC: [address]-8

## 2019-07-20 ENCOUNTER — Ambulatory Visit: Payer: BC Managed Care – PPO | Attending: Internal Medicine

## 2019-07-20 DIAGNOSIS — Z23 Encounter for immunization: Secondary | ICD-10-CM

## 2019-07-20 NOTE — Progress Notes (Signed)
   Covid-19 Vaccination Clinic  Name:  Jorge Holt    MRN: [id number] DOB: 1970-08-22  07/20/2019  [name] was observed post Covid-19 immunization for 15 minutes without incident. He was provided with Vaccine Information Sheet and instruction to access the V-Safe system.   [name] was instructed to call 911 with any severe reactions post vaccine: Marland Kitchen Difficulty breathing  . Swelling of face and throat  . A fast heartbeat  . A bad rash all over body  . Dizziness and weakness   Immunizations Administered    Name Date Dose VIS Date Route   Pfizer COVID-19 Vaccine 07/20/2019 11:53 AM 0.3 mL 05/27/2018 Intramuscular   Manufacturer: [employer], [employer]   Lot: [id number]   NDC: [address]-9

## 2021-04-09 IMAGING — CT CT HEAD WITHOUT CONTRAST
3 series · 15 of 47 positions shown, 18 images · non-contrast
Comparison: Head CTs 06/11/2007 and earlier.

CLINICAL DATA: 48-year-old male status post 1st seizure in 2 years

EXAM:
CT HEAD WITHOUT CONTRAST
TECHNIQUE: Contiguous axial images were obtained from the base of the skull
through the vertex without intravenous contrast.

[Series 2: head wo · axial · 0.45mm/px · z∈[+485,+620]mm · 9 of 33 slices shown, 12 images]
[im 3/33  brain]
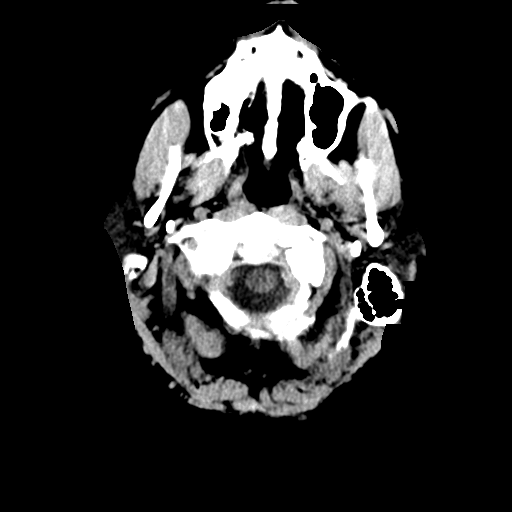
[im 3/33  bone]
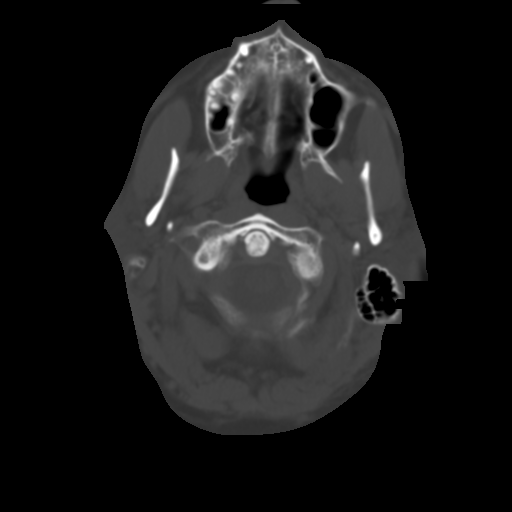
[im 6/33  brain]
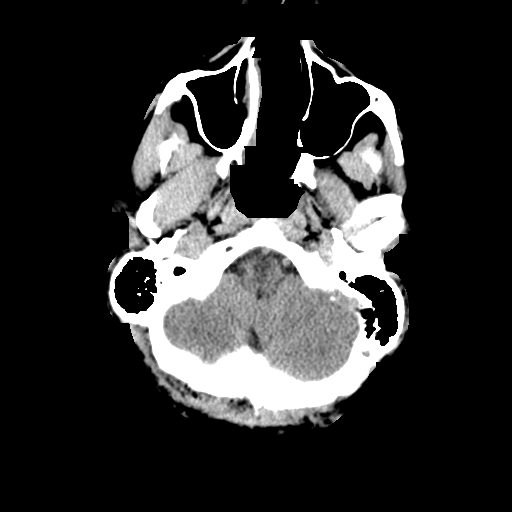
[im 9/33  brain]
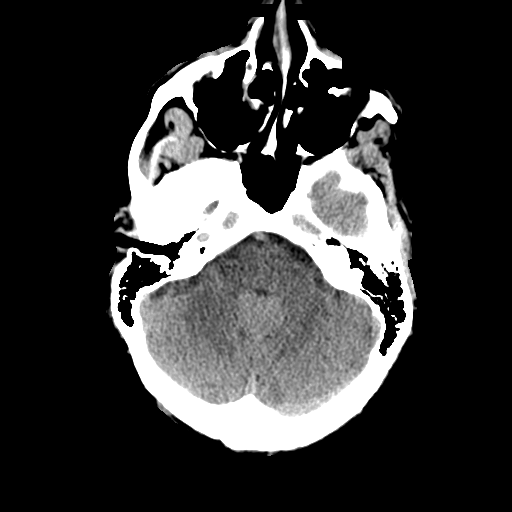
[im 13/33  brain]
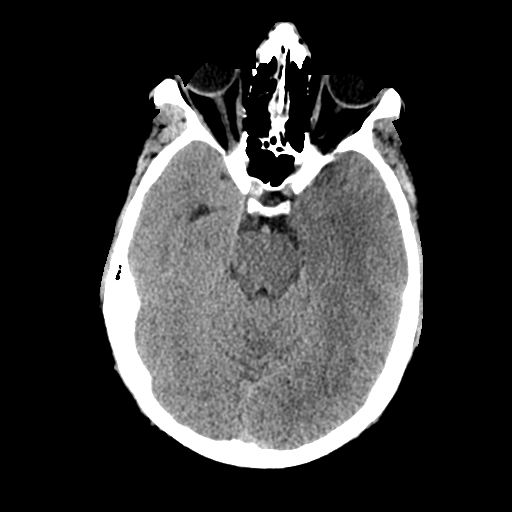
[im 17/33  brain]
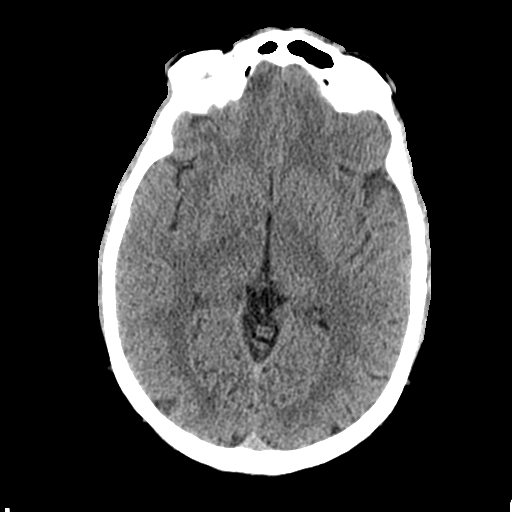
[im 17/33  bone]
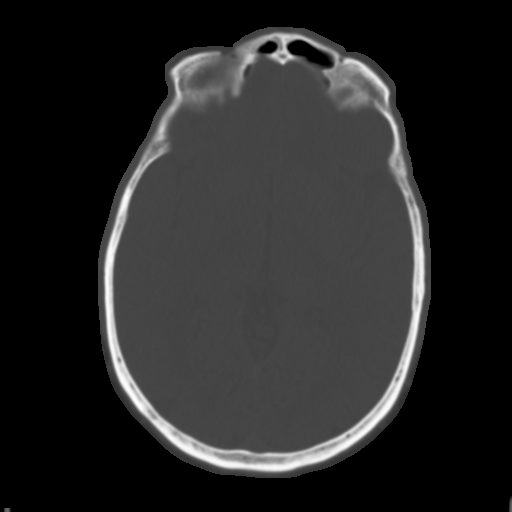
[im 20/33  brain]
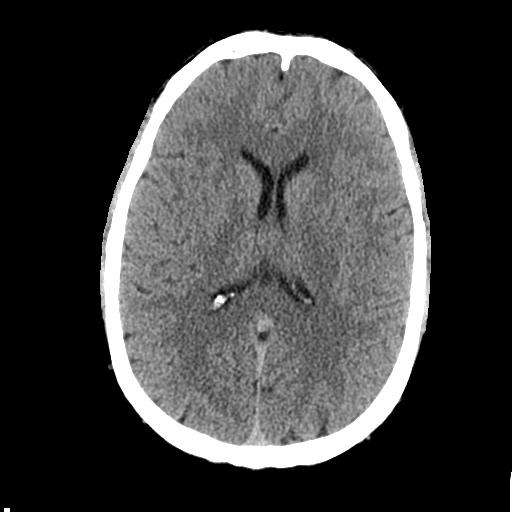
[im 24/33  brain]
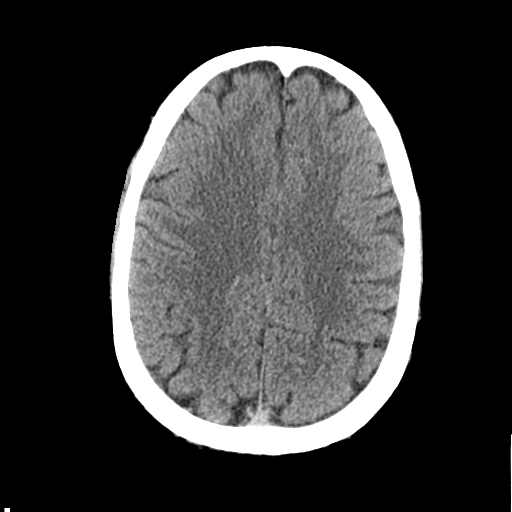
[im 27/33  brain]
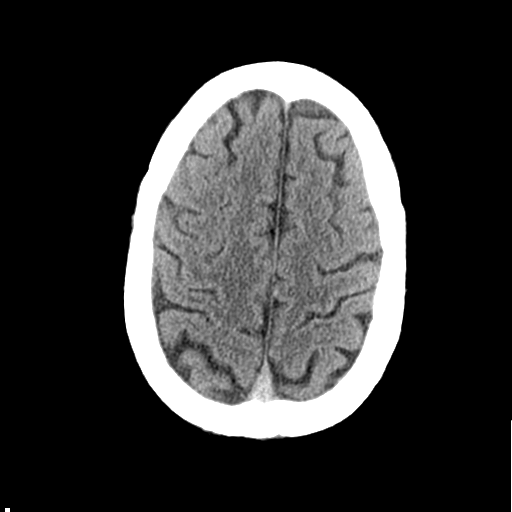
[im 30/33  brain]
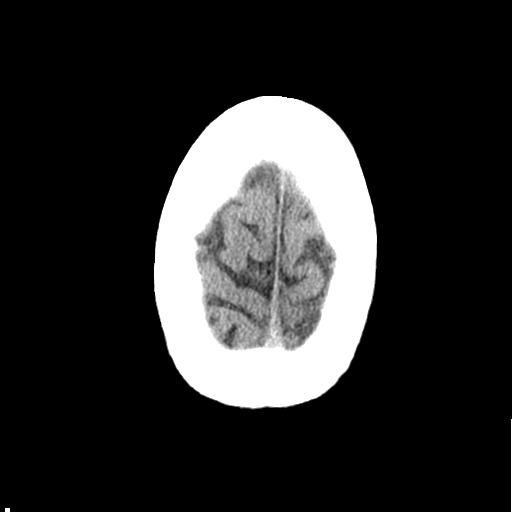
[im 30/33  bone]
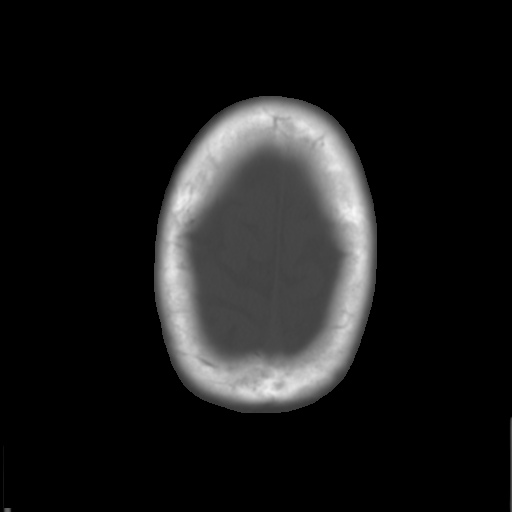

[Series 4: coronal soft tissue · coronal · 0.33mm/px · 3 of 72 slices shown]
[im 24/72  brain]
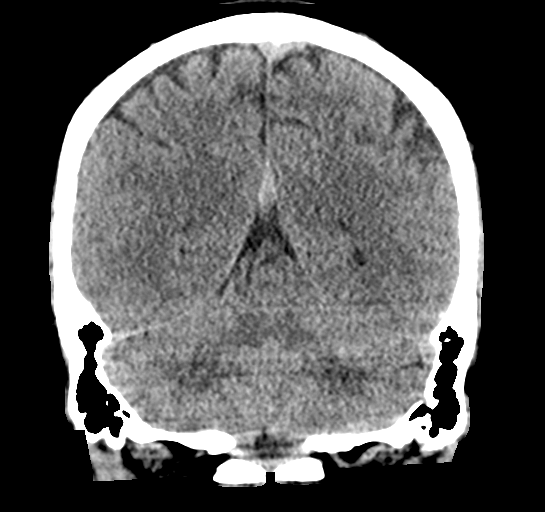
[im 32/72  brain]
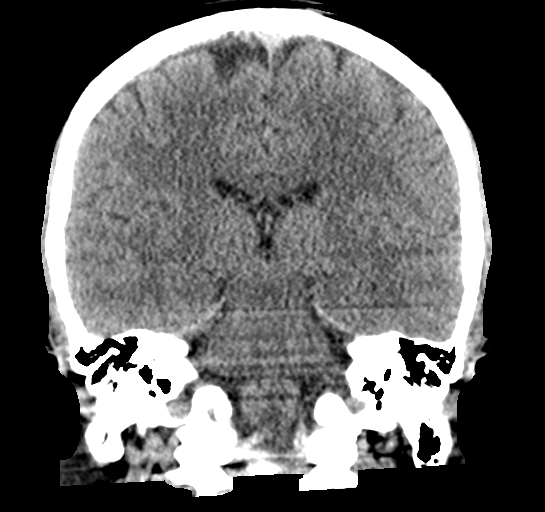
[im 40/72  brain]
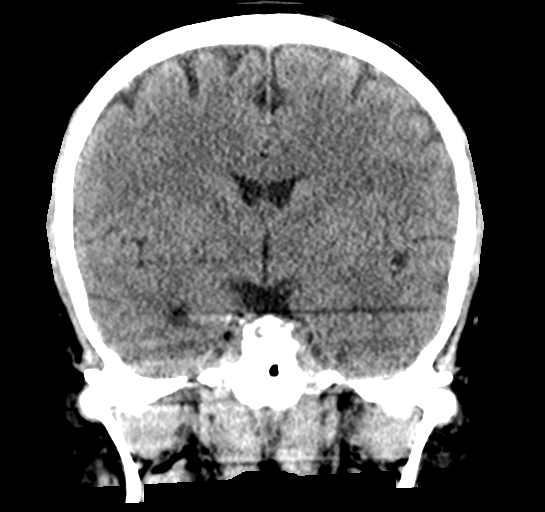

[Series 5: sagittal soft tissue · sagittal · 0.33mm/px · 3 of 55 slices shown]
[im 19/55  brain]
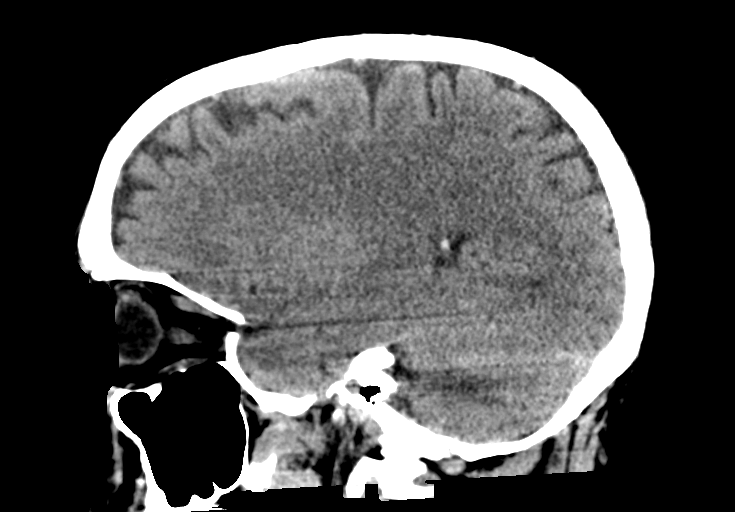
[im 28/55  brain]
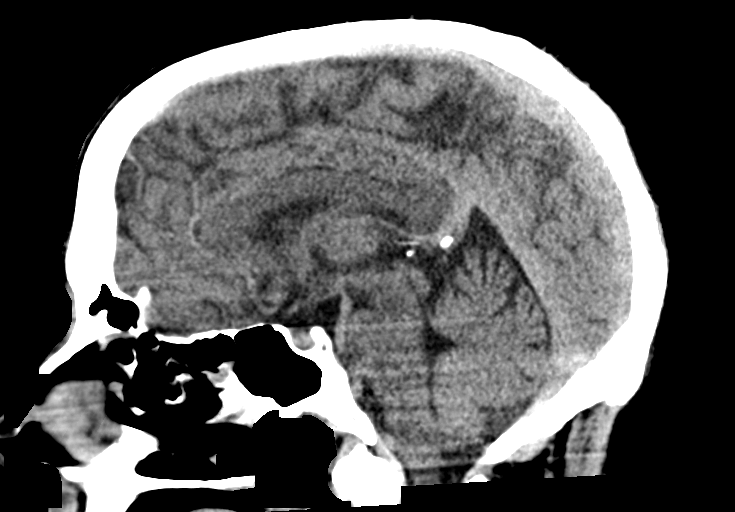
[im 37/55  brain]
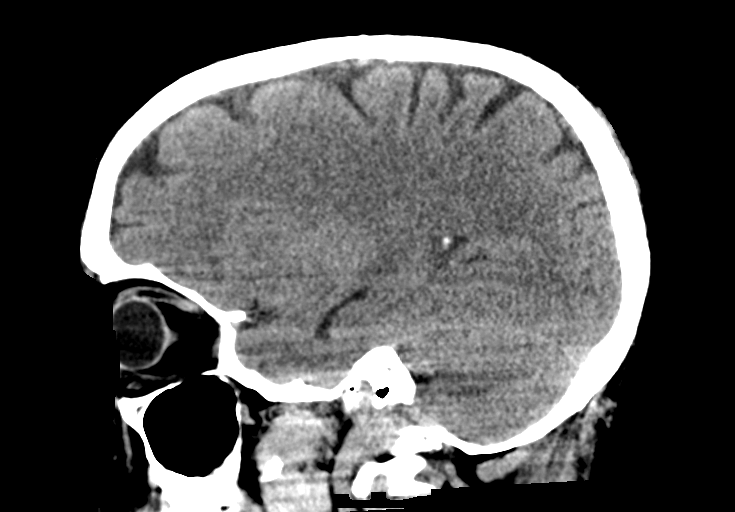

[15 of 47 positions shown; findings below may reference images not displayed]

FINDINGS: Brain: Cerebral volume is stable and within normal limits. No
midline shift, ventriculomegaly, mass effect, evidence of mass
lesion, intracranial hemorrhage or evidence of cortically based
acute infarction. Gray-white matter differentiation is within normal
limits throughout the brain. No encephalomalacia identified.

Vascular: Mild Calcified atherosclerosis at the skull base. No
suspicious intracranial vascular hyperdensity.

Skull: No acute osseous abnormality identified. Congenital
incomplete ossification of the posterior C1 ring.

Sinuses/Orbits: Scattered mild mucosal thickening. No sinus fluid
levels. Tympanic cavities and mastoids remain clear.

Other: Visualized orbits and scalp soft tissues are within normal
limits.
IMPRESSION: Stable and normal noncontrast CT appearance of the brain.

## 2021-04-10 IMAGING — DX PORTABLE CHEST - 1 VIEW
1 series · 1 of 1 positions shown · non-contrast
Comparison: 02/28/2004

CLINICAL DATA: Cough

EXAM:
PORTABLE CHEST 1 VIEW

[chest ap]
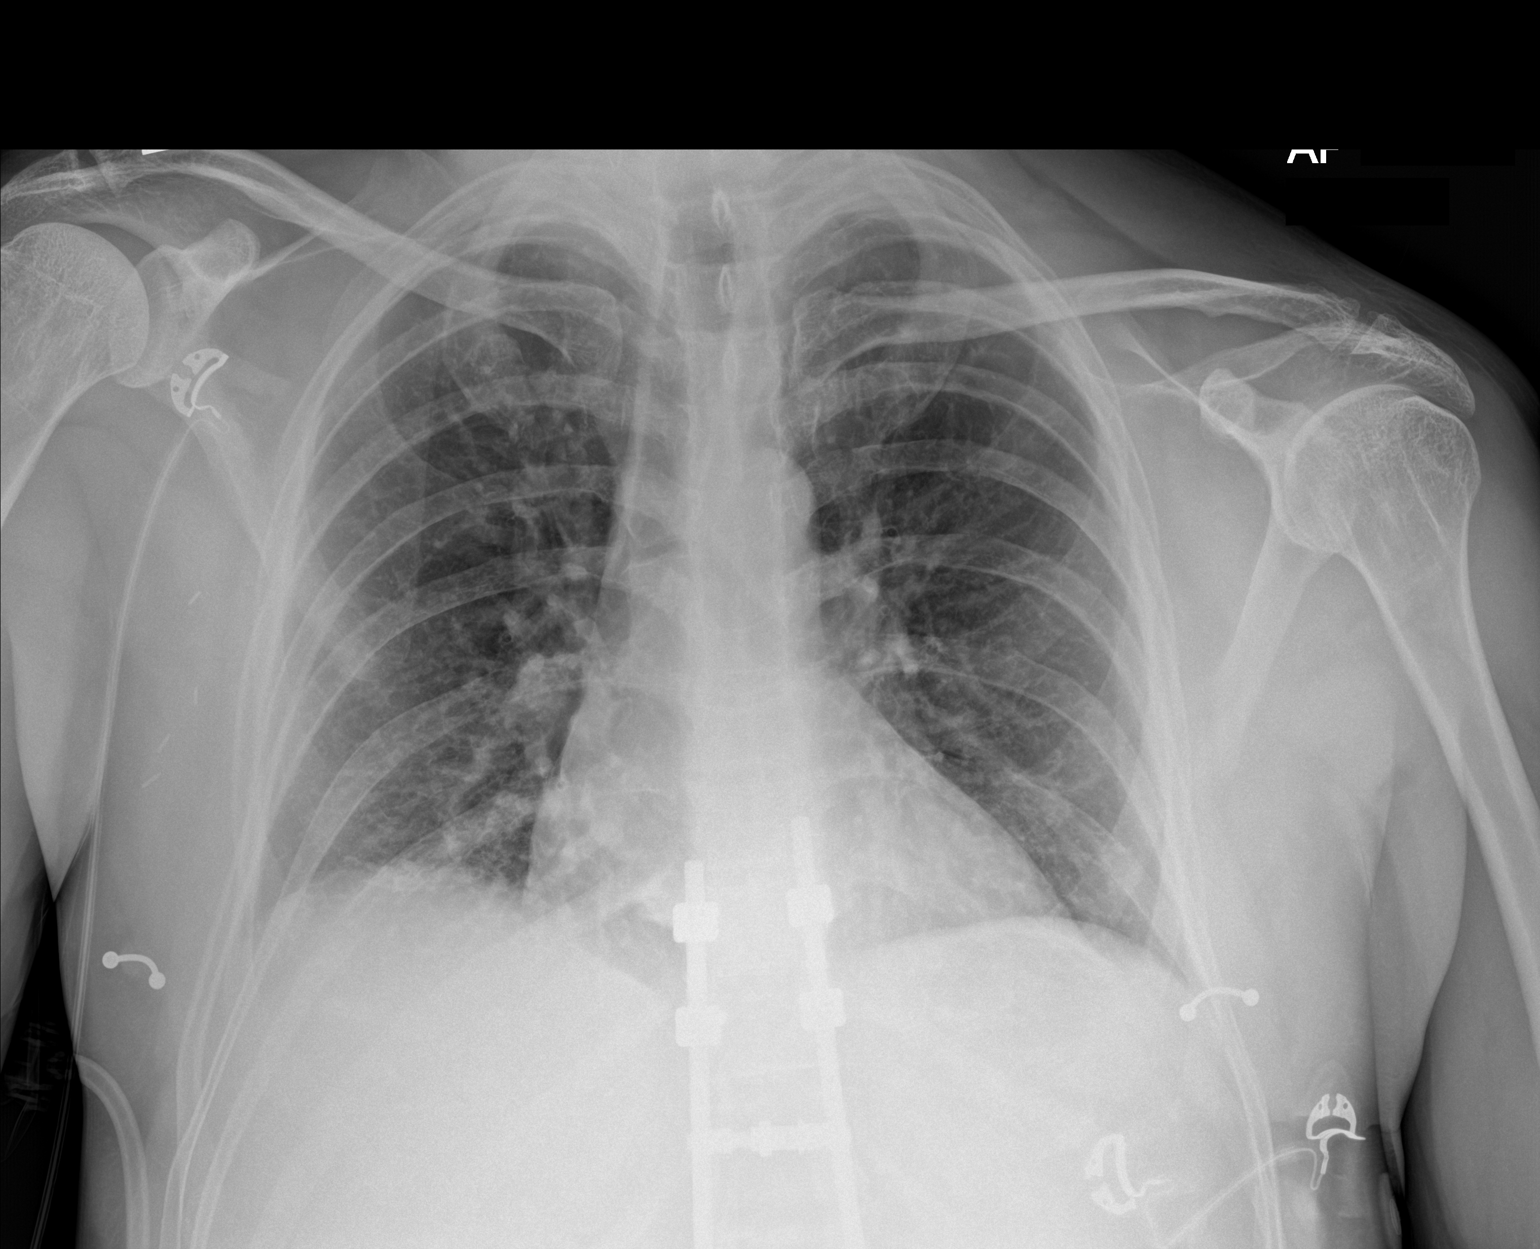

[1 of 1 positions shown; findings below may reference images not displayed]

FINDINGS: Heart is normal size. Airspace opacity at the right lung base could
reflect atelectasis or developing infiltrate. Left lung clear. No
effusions or acute bony abnormality.
IMPRESSION: Airspace opacity at the right lung base could reflect atelectasis or
developing infiltrate/pneumonia.
